# Patient Record
Sex: Female | Born: 1975
Health system: Southern US, Community
[De-identification: ages and names within clinical notes are randomized; demographics above are authoritative.]

## PROBLEM LIST (undated history)

## (undated) HISTORY — PX: WISDOM TOOTH EXTRACTION: SHX21

## (undated) HISTORY — PX: APPENDECTOMY: SHX54

---

## 1998-10-31 ENCOUNTER — Inpatient Hospital Stay (HOSPITAL_COMMUNITY): Admission: AD | Admit: 1998-10-31 | Discharge: 1998-10-31 | Payer: Self-pay | Admitting: Obstetrics and Gynecology

## 1999-06-21 ENCOUNTER — Other Ambulatory Visit: Admission: RE | Admit: 1999-06-21 | Discharge: 1999-06-21 | Payer: Self-pay | Admitting: Obstetrics and Gynecology

## 1999-07-29 ENCOUNTER — Ambulatory Visit (HOSPITAL_COMMUNITY): Admission: RE | Admit: 1999-07-29 | Discharge: 1999-07-29 | Payer: Self-pay | Admitting: Obstetrics and Gynecology

## 2000-03-14 ENCOUNTER — Encounter: Payer: Self-pay | Admitting: Emergency Medicine

## 2000-03-14 ENCOUNTER — Emergency Department (HOSPITAL_COMMUNITY): Admission: EM | Admit: 2000-03-14 | Discharge: 2000-03-14 | Payer: Self-pay | Admitting: Emergency Medicine

## 2000-06-27 ENCOUNTER — Other Ambulatory Visit: Admission: RE | Admit: 2000-06-27 | Discharge: 2000-06-27 | Payer: Self-pay | Admitting: Obstetrics and Gynecology

## 2001-07-24 ENCOUNTER — Other Ambulatory Visit: Admission: RE | Admit: 2001-07-24 | Discharge: 2001-07-24 | Payer: Self-pay | Admitting: *Deleted

## 2002-08-19 ENCOUNTER — Other Ambulatory Visit: Admission: RE | Admit: 2002-08-19 | Discharge: 2002-08-19 | Payer: Self-pay | Admitting: *Deleted

## 2002-11-17 ENCOUNTER — Encounter: Payer: Self-pay | Admitting: Chiropractic Medicine

## 2002-11-17 ENCOUNTER — Ambulatory Visit (HOSPITAL_COMMUNITY): Admission: RE | Admit: 2002-11-17 | Discharge: 2002-11-17 | Payer: Self-pay | Admitting: Chiropractic Medicine

## 2003-09-20 ENCOUNTER — Other Ambulatory Visit: Admission: RE | Admit: 2003-09-20 | Discharge: 2003-09-20 | Payer: Self-pay | Admitting: *Deleted

## 2004-04-19 ENCOUNTER — Emergency Department (HOSPITAL_COMMUNITY): Admission: EM | Admit: 2004-04-19 | Discharge: 2004-04-20 | Payer: Self-pay | Admitting: Emergency Medicine

## 2004-08-21 ENCOUNTER — Inpatient Hospital Stay (HOSPITAL_COMMUNITY): Admission: AD | Admit: 2004-08-21 | Discharge: 2004-08-23 | Payer: Self-pay | Admitting: *Deleted

## 2008-04-04 ENCOUNTER — Inpatient Hospital Stay (HOSPITAL_COMMUNITY): Admission: AD | Admit: 2008-04-04 | Discharge: 2008-04-06 | Payer: Self-pay | Admitting: *Deleted

## 2009-06-08 ENCOUNTER — Emergency Department (HOSPITAL_COMMUNITY): Admission: EM | Admit: 2009-06-08 | Discharge: 2009-06-08 | Payer: Self-pay | Admitting: Emergency Medicine

## 2010-06-09 ENCOUNTER — Ambulatory Visit: Payer: Self-pay | Admitting: Family Medicine

## 2010-06-09 DIAGNOSIS — N809 Endometriosis, unspecified: Secondary | ICD-10-CM | POA: Insufficient documentation

## 2010-06-09 DIAGNOSIS — E288 Other ovarian dysfunction: Secondary | ICD-10-CM

## 2010-06-09 DIAGNOSIS — M999 Biomechanical lesion, unspecified: Secondary | ICD-10-CM | POA: Insufficient documentation

## 2010-06-09 DIAGNOSIS — K5900 Constipation, unspecified: Secondary | ICD-10-CM

## 2010-06-09 DIAGNOSIS — R3915 Urgency of urination: Secondary | ICD-10-CM

## 2010-06-09 DIAGNOSIS — M9981 Other biomechanical lesions of cervical region: Secondary | ICD-10-CM

## 2010-06-09 HISTORY — DX: Endometriosis, unspecified: N80.9

## 2010-06-09 HISTORY — DX: Urgency of urination: R39.15

## 2010-06-09 HISTORY — DX: Biomechanical lesion, unspecified: M99.9

## 2010-06-09 HISTORY — DX: Constipation, unspecified: K59.00

## 2010-06-09 HISTORY — DX: Other ovarian dysfunction: E28.8

## 2010-06-09 LAB — CONVERTED CEMR LAB
Beta hcg, urine, semiquantitative: NEGATIVE
Bilirubin Urine: NEGATIVE
Ketones, urine, test strip: NEGATIVE
Nitrite: NEGATIVE
Protein, U semiquant: NEGATIVE
Urobilinogen, UA: 0.2

## 2010-11-28 NOTE — Assessment & Plan Note (Signed)
Summary: np/[employer] employee/eo   Vital Signs:  Patient profile:   35 year old female Weight:      164 pounds Temp:     98.8 degrees F oral Pulse rate:   74 / minute BP sitting:   141 / 74  (left arm) Cuff size:   regular  Vitals Entered By: Jimmy Footman, CMA (June 09, 2010 1:44 PM)  Primary Care Dean Wonder:  Antoine Primas DO   History of Present Illness: 35 yo female here to establish care  1.  Low back pain, seems to be chronic in nature worse with long amount of sitting or standing no real movement makes it worse, no radiation down the legs, pt had seen a chiropractor before which did help some but was interested in the OMT.  Pt did take motrin from time to time  2.  Endometriosis-  Pt has had it since 1995 multiple lap surgeries that have helped, in the process of having another one at this time, doing very well, has tried birth control but did not like how they made her fell and did not make a difference.  Pt states that it is stage 4 and has had some associated problems such as constipation.  Lst mestral period was in June.   3.  Constipation-  Pt had a colonoscopy about 1 year ago which was normal due to this chronic constipation and diarreha.  Was normal. Pt now states she usually has soft stool does not seem to be too bad. Pt thinks it is her being constipated but doesn't seem to change with food or any other meds she has tried. Denies abdominal pain   4.  Urinary urgency-  Has been chronic since last laproscopic procedure.  Pt was supposed to see aspecialist but did not make the appt.  Pt at the moment says it is not really bad and able to control it, denies fever, chills, nausea, vomiting, dysuria or blood in urine.   5.  Get pap smear by her OB/gyn never had an abnormal one  Current Medications (verified): 1)  None  Allergies (verified): 1)  ! Codeine Sulfate (Codeine Sulfate) 2)  ! Percocet (Oxycodone-Acetaminophen) 3)  ! Levaquin 4)  ! 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   Physical Exam  General:  Well-developed,well-nourished,in no acute distress; alert,appropriate and cooperative throughout examination, vitals reviewed  Eyes:  PERRLA, EOMI Ears:  TM intact b/l Mouth:  MMM uvula midline Lungs:  CTAB  Heart:  RRR no murmu Abdomen:  BS+, minimal tender to palpation over suprapubic area ND Msk:  5/5 strength OMT findings Cervical region C1L, C3-5 R elevated first rib right Thoracic: T3 RSr, T5RSl  T7-9 RSRonL Lumbar L2 RSL Sacrum: left on left.  Pulses:  2+ Extremities:  no edema Neurologic:  CN 2-12 intact Skin:  no rash or lesions appreciated    Impression & Recommendations:  Problem # 1:  URINARY URGENCY, MILD (ICD-788.63) Pt seems to be controlled with hx of endometriosis being stage 4 could be of concern, minimal tender to palpation over bladder on exam but UA normal u preg negative,  will monitor, if worsen will send to urology.   Orders: Urinalysis-[facility] (00000)  Problem # 2:  CONSTIPATION (ICD-564.00) Pt told to increase fiber intakecan consider taking miralax and colace but pt never due to having loose stool already, if continue on f/u appt will have to do rectm exam and see if there is any stool in the vault, no pain at moment. will monitor progress.   Problem # 3:  Preventive Health Care (ICD-V70.0) will get CBC, CMET and FLP soon pt recently had pap smear at [facility], nothing else indicated at this time.   Problem # 4:  ENDOMETRIOSIS (ICD-617.9) will attempt to get more records of problem, sounds like it is throughout the  abdominal cavity, pt set on having another lap to remove more eventhough warn likely will grow back again.  Pt told the risks and still opting for surgery.  will follow.   Other Orders: U Preg-[facility] (81025) OMT 3-4 Body [facility] [id number]) Future Orders: Comp Met-[facility] ([id number]) ... 06/22/2010 Lipid-[facility] ([id number]) ... 06/22/2010 CBC w/Diff-[facility] [id number]) ... 06/22/2010  Patient Instructions: 1)  Great to see you! 2)  I want you to come back in the next week or so and get those labs done 3)  Keep me informed on when you wil have the procedure 4)  If your back acts up again I will do more manipulation 5)  When I get your results I will call you.   Laboratory Results   Urine Tests  Date/Time Received: June 09, 2010 2:21 PM  Date/Time Reported: June 09, 2010 2:34 PM   Routine Urinalysis   Color: yellow Appearance: Clear Glucose: negative   (Normal Range: Negative) Bilirubin: negative   (Normal Range: Negative) Ketone: negative   (Normal Range: Negative) Spec. Gravity: 1.020   (Normal Range: 1.003-1.035) Blood: negative   (Normal Range: Negative) pH: 7.0   (Normal Range: 5.0-8.0) Protein: negative   (Normal Range: Negative) Urobilinogen: 0.2   (Normal Range: 0-1) Nitrite: negative   (Normal Range: Negative) Leukocyte Esterace: negative   (Normal Range: Negative)    Urine HCG: negative Comments: ...............test performed by......Marland KitchenBonnie A. [place], MLS (ASCP)cm

## 2011-02-03 LAB — WET PREP, GENITAL
Clue Cells Wet Prep HPF POC: NONE SEEN
Trich, Wet Prep: NONE SEEN

## 2011-02-03 LAB — CBC
MCHC: 33.2 g/dL (ref 30.0–36.0)
MCV: 85.3 fL (ref 78.0–100.0)
Platelets: 258 10*3/uL (ref 150–400)
RDW: 12.9 % (ref 11.5–15.5)

## 2011-02-03 LAB — DIFFERENTIAL
Eosinophils Relative: 0 % (ref 0–5)
Lymphocytes Relative: 5 % — ABNORMAL LOW (ref 12–46)
Lymphs Abs: 0.7 10*3/uL (ref 0.7–4.0)
Monocytes Relative: 1 % — ABNORMAL LOW (ref 3–12)

## 2011-02-03 LAB — COMPREHENSIVE METABOLIC PANEL
AST: 18 U/L (ref 0–37)
Albumin: 4.2 g/dL (ref 3.5–5.2)
Calcium: 9.4 mg/dL (ref 8.4–10.5)
Creatinine, Ser: 0.78 mg/dL (ref 0.4–1.2)
GFR calc Af Amer: >60 mL/min (ref >60)
GFR calc non Af Amer: >60 mL/min (ref >60)
Sodium: 140 mEq/L (ref 135–145)
Total Protein: 7.8 g/dL (ref 6.0–8.3)

## 2011-02-03 LAB — URINALYSIS, ROUTINE W REFLEX MICROSCOPIC
Glucose, UA: NEGATIVE mg/dL
Nitrite: NEGATIVE
Specific Gravity, Urine: 1.027 (ref 1.005–1.030)
pH: 5.5 (ref 5.0–8.0)

## 2011-02-03 LAB — GC/CHLAMYDIA PROBE AMP, GENITAL: GC Probe Amp, Genital: NEGATIVE

## 2011-02-03 LAB — URINE MICROSCOPIC-ADD ON

## 2011-03-13 NOTE — H&P (Signed)
Tina Gordon, [name]               ACCOUNT NO.:  [id number]   MEDICAL RECORD NO.:  [id number]          PATIENT TYPE:  INP   LOCATION:  9167                          [facility]ILITY:  [facility]   PHYSICIAN:  Tina Gordon, M.D.DATE OF BIRTH:  12-Jan-1976   DATE OF ADMISSION:  04/04/2008  DATE OF DISCHARGE:  04/04/2008                              HISTORY & PHYSICAL   CHIEF COMPLAINT:  Elevated blood pressure, preterm labor.   HISTORY OF PRESENT ILLNESS:  She is a 35 year old white female G 4, P 1,  at [redacted] weeks gestation who presents with a blood pressure of 140s/90s,  increased contractions and elevated blood pressure for augmentation.   ALLERGIES:  SHE HAS ALLERGIES TO CODEINE.   MEDICATIONS:  Prenatal vitamins.   SOCIAL HISTORY:  She is a nonsmoker, nondrinker.  She denies __________.   PAST MEDICAL HISTORY:  She has a past medical history of depression,  endometriosis, gastric ulcer, asthma, migraine, and irritable bowel.  She has a noncontributory social history.   FAMILY HISTORY:  She has a family history of kidney stones,  hypertension, thyroid dysfunction, seizure disorder, stroke,  osteoarthritis, bipolar disorder and depression.  She has a personal  history of two uncomplicated SABs in 2008 and 2004, and a vaginal  delivery of a C-section of an 8 pound female in 2006.   PHYSICAL EXAMINATION:  She is a well developed, well nourished, white  female in mild amount of distress.  Blood pressure 169/88.  HEENT:  Normal.  LUNGS:  Clear.  HEART:  Regular rhythm.  ABDOMEN:  Soft, gravid, nontender,  No abdominal tenderness.  EXTREMITIES:  DTRs 2+ with no evidence of clonus.  __________.  NEUROLOGICAL EXAM:  Nonfocal.  SKIN:  Intact.  CERVIX:  3 to 4 cm, 70% vertex -1.  Amniotomy clear fluid noted.   IMPRESSION:  1. Thirty-nine week intrauterine pregnancy.  2. Probable gestational hypertension.   PLAN:  Proceed with labor augmentation, epidural and attempts at vaginal   delivery.      Tina Gordon, M.D.  Electronically Signed     RJT/MEDQ  D:  04/04/2008  T:  04/04/2008  Job:  [id number]

## 2011-03-16 NOTE — H&P (Signed)
NAME:  Tina Gordon, Tina Gordon               ACCOUNT NO.:  [id number]   MEDICAL RECORD NO.:  [id number]          PATIENT TYPE:  INP   LOCATION:  9160                          [facility]ILITY[facility]   PHYSICIAN:  [place]an[place] B. Earlene Plater, M.D.  DATE OF BIRTH:  22-Nov-1975   DATE OF ADMISSION:  08/21/2004  DATE OF DISCHARGE:                                HISTORY & PHYSICAL   ADMISSION DIAGNOSES:  1.  Thirty-nine-plus-week intrauterine pregnancy.  2.  Possible large-for-gestational-age fetus.   HISTORY OF PRESENT ILLNESS:  A 35 year old white female gravida 2 para 0 A 1  at 39+ weeks for admission for induction of labor.  Pregnancy is complicated  by excessive weight gain at greater than 50 pounds.  A recent ultrasound  showed the estimated fetal weight to be 3522 g.   Prenatal care also otherwise complicated by group B strep urinary tract  infection.   PAST MEDICAL HISTORY, PAST SURGICAL HISTORY, FAMILY HISTORY:  See prenatal  record.   PRENATAL LABORATORY DATA:  See prenatal record.  Group B strep is positive.  Blood type is O positive.   PHYSICAL EXAMINATION:  VITAL SIGNS:  The patient is afebrile with stable  vital signs.  HEART:  Regular rate and rhythm.  LUNGS:  Clear to auscultation.  ABDOMEN:  Fundal height is 40 cm.  PELVIC:  Cervix is 1-2 cm dilated, 50% effaced, -2 station, and vertex.   Fetal heart tones are reactive.   ASSESSMENT:  Thirty-nine-plus-week intrauterine pregnancy, possible large-  for-gestational-age fetus, for induction of labor.  The patient was advised  of the potential increased risk of cesarean section with induction versus  expectant management.     Wesl   WBD/MEDQ  D:  08/21/2004  T:  08/21/2004  Job:  [id number]

## 2011-07-26 LAB — COMPREHENSIVE METABOLIC PANEL
ALT: 15
AST: 31
CO2: 22
Calcium: 9.2
GFR calc Af Amer: >60
Sodium: 135
Total Protein: 6.4

## 2011-07-26 LAB — CBC
HCT: 32.5 — ABNORMAL LOW
MCHC: 34.3
MCV: 87.8
RBC: 3.7 — ABNORMAL LOW
RBC: 4.12
RDW: 14.7
WBC: 16.5 — ABNORMAL HIGH

## 2012-03-11 ENCOUNTER — Emergency Department (HOSPITAL_COMMUNITY)
Admission: EM | Admit: 2012-03-11 | Discharge: 2012-03-11 | Disposition: A | Discharge status: Home / Self Care | Payer: 59 | Source: Home / Self Care | Attending: Emergency Medicine | Admitting: Emergency Medicine

## 2012-03-11 ENCOUNTER — Encounter (HOSPITAL_COMMUNITY): Payer: Self-pay | Admitting: Cardiology

## 2012-03-11 ENCOUNTER — Other Ambulatory Visit (HOSPITAL_COMMUNITY): Payer: Self-pay | Admitting: Orthopedic Surgery

## 2012-03-11 ENCOUNTER — Emergency Department (INDEPENDENT_AMBULATORY_CARE_PROVIDER_SITE_OTHER): Payer: 59

## 2012-03-11 DIAGNOSIS — M25569 Pain in unspecified knee: Secondary | ICD-10-CM

## 2012-03-11 DIAGNOSIS — M2390 Unspecified internal derangement of unspecified knee: Secondary | ICD-10-CM

## 2012-03-11 NOTE — ED Provider Notes (Signed)
Medical screening examination/treatment/procedure(s) were performed by non-physician practitioner and as supervising physician I was immediately available for consultation/collaboration.  Kerrilyn Azbill   Shelma Eiben, MD 03/11/12 1031 

## 2012-03-11 NOTE — ED Notes (Signed)
Pt reports left knee pain that started a month ago [place] Pt felt and heard a pop to her left knee. She states it was tight and achy at first got better and has now been aching and throbbing. Pain is worse with standing for long periods of time and hurts down to her ankle. Pt reports sharpe/cramping  pain to anterior/posterior knee after standing for long periods. Pt has been doing normal ADL's.

## 2012-03-11 NOTE — Discharge Instructions (Signed)
Knee Pain The knee is the complex joint between your thigh and your lower leg. It is made up of bones, tendons, ligaments, and cartilage. The bones that make up the knee are:  The femur in the thigh.   The tibia and fibula in the lower leg.   The patella or kneecap riding in the groove on the lower femur.  CAUSES  Knee pain is a common complaint with many causes. A few of these causes are:  Injury, such as:   A ruptured ligament or tendon injury.   Torn cartilage.   Medical conditions, such as:   Gout   Arthritis   Infections   Overuse, over training or overdoing a physical activity.  Knee pain can be minor or severe. Knee pain can accompany debilitating injury. Minor knee problems often respond well to self-care measures or get well on their own. More serious injuries may need medical intervention or even surgery. SYMPTOMS The knee is complex. Symptoms of knee problems can vary widely. Some of the problems are:  Pain with movement and weight bearing.   Swelling and tenderness.   Buckling of the knee.   Inability to straighten or extend your knee.   Your knee locks and you cannot straighten it.   Warmth and redness with pain and fever.   Deformity or dislocation of the kneecap.  DIAGNOSIS  Determining what is wrong may be very straight forward such as when there is an injury. It can also be challenging because of the complexity of the knee. Tests to make a diagnosis may include:  Your caregiver taking a history and doing a physical exam.   Routine X-rays can be used to rule out other problems. X-rays will not reveal a cartilage tear. Some injuries of the knee can be diagnosed by:   Arthroscopy a surgical technique by which a small video camera is inserted through tiny incisions on the sides of the knee. This procedure is used to examine and repair internal knee joint problems. Tiny instruments can be used during arthroscopy to repair the torn knee cartilage  (meniscus).   Arthrography is a radiology technique. A contrast liquid is directly injected into the knee joint. Internal structures of the knee joint then become visible on X-ray film.   An MRI scan is a non x-ray radiology procedure in which magnetic fields and a computer produce two- or three-dimensional images of the inside of the knee. Cartilage tears are often visible using an MRI scanner. MRI scans have largely replaced arthrography in diagnosing cartilage tears of the knee.   Blood work.   Examination of the fluid that helps to lubricate the knee joint (synovial fluid). This is done by taking a sample out using a needle and a syringe.  TREATMENT The treatment of knee problems depends on the cause. Some of these treatments are:  Depending on the injury, proper casting, splinting, surgery or physical therapy care will be needed.   Give yourself adequate recovery time. Do not overuse your joints. If you begin to get sore during workout routines, back off. Slow down or do fewer repetitions.   For repetitive activities such as cycling or running, maintain your strength and nutrition.   Alternate muscle groups. For example if you are a weight lifter, work the upper body on one day and the lower body the next.   Either tight or weak muscles do not give the proper support for your knee. Tight or weak muscles do not absorb the stress placed   on the knee joint. Keep the muscles surrounding the knee strong.   Take care of mechanical problems.   If you have flat feet, orthotics or special shoes may help. See your caregiver if you need help.   Arch supports, sometimes with wedges on the inner or outer aspect of the heel, can help. These can shift pressure away from the side of the knee most bothered by osteoarthritis.   A brace called an "unloader" brace also may be used to help ease the pressure on the most arthritic side of the knee.   If your caregiver has prescribed crutches, braces,  wraps or ice, use as directed. The acronym for this is PRICE. This means protection, rest, ice, compression and elevation.   Nonsteroidal anti-inflammatory drugs (NSAID's), can help relieve pain. But if taken immediately after an injury, they may actually increase swelling. Take NSAID's with food in your stomach. Stop them if you develop stomach problems. Do not take these if you have a history of ulcers, stomach pain or bleeding from the bowel. Do not take without your caregiver's approval if you have problems with fluid retention, heart failure, or kidney problems.   For ongoing knee problems, physical therapy may be helpful.   Glucosamine and chondroitin are over-the-counter dietary supplements. Both may help relieve the pain of osteoarthritis in the knee. These medicines are different from the usual anti-inflammatory drugs. Glucosamine may decrease the rate of cartilage destruction.   Injections of a corticosteroid drug into your knee joint may help reduce the symptoms of an arthritis flare-up. They may provide pain relief that lasts a few months. You may have to wait a few months between injections. The injections do have a small increased risk of infection, water retention and elevated blood sugar levels.   Hyaluronic acid injected into damaged joints may ease pain and provide lubrication. These injections may work by reducing inflammation. A series of shots may give relief for as long as 6 months.   Topical painkillers. Applying certain ointments to your skin may help relieve the pain and stiffness of osteoarthritis. Ask your pharmacist for suggestions. Many over the-counter products are approved for temporary relief of arthritis pain.   In some countries, doctors often prescribe topical NSAID's for relief of chronic conditions such as arthritis and tendinitis. A review of treatment with NSAID creams found that they worked as well as oral medications but without the serious side effects.    PREVENTION  Maintain a healthy weight. Extra pounds put more strain on your joints.   Get strong, stay limber. Weak muscles are a common cause of knee injuries. Stretching is important. Include flexibility exercises in your workouts.   Be smart about exercise. If you have osteoarthritis, chronic knee pain or recurring injuries, you may need to change the way you exercise. This does not mean you have to stop being active. If your knees ache after jogging or playing basketball, consider switching to swimming, water aerobics or other low-impact activities, at least for a few days a week. Sometimes limiting high-impact activities will provide relief.   Make sure your shoes fit well. Choose footwear that is right for your sport.   Protect your knees. Use the proper gear for knee-sensitive activities. Use kneepads when playing volleyball or laying carpet. Buckle your seat belt every time you drive. Most shattered kneecaps occur in car accidents.   Rest when you are tired.  SEEK MEDICAL CARE IF:  You have knee pain that is continual and does not   seem to be getting better.  SEEK IMMEDIATE MEDICAL CARE IF:  Your knee joint feels hot to the touch and you have a high fever. MAKE SURE YOU:   Understand these instructions.   Will watch your condition.   Will get help right away if you are not doing well or get worse.  Document Released: 08/12/2007 Document Revised: 10/04/2011 Document Reviewed: 08/12/2007 Exit[employer] Patient Information 2012 [employer]. 

## 2012-03-11 NOTE — ED Provider Notes (Signed)
History     CSN: [id number]  Arrival date & time 03/11/12  4098   First MD Initiated Contact with Patient 03/11/12 [phone]      No chief complaint on file.   (Consider location/radiation/quality/duration/timing/severity/associated sxs/prior treatment) Patient is a 36 y.o. female presenting with knee pain. The history is provided by the patient. No language interpreter was used.  Knee Pain This is a new problem. The current episode started 12 to 24 hours ago. The problem occurs constantly. The problem has been gradually worsening. The symptoms are aggravated by walking. The symptoms are relieved by nothing. She has tried nothing for the symptoms.   Pt reports she began having knee pain about a month ago.  Pt reports pain with walking.   Pt does not recall any injury.  Pt has pain to the anterior lateral aspect No past medical history on file.  No past surgical history on file.  No family history on file.  History  Substance Use Topics  . Smoking status: Not on file  . Smokeless tobacco: Not on file  . Alcohol Use: Not on file    OB History    No data available      Review of Systems  Musculoskeletal: Positive for myalgias and joint swelling. Negative for gait problem.  All other systems reviewed and are negative.    Allergies  Codeine sulfate; Levofloxacin; Oxycodone-acetaminophen; and Sulfonamide derivatives  Home Medications  No current outpatient prescriptions on file.  BP 133/79  Pulse 76  Temp(Src) 98.4 F (36.9 C) (Oral)  Resp 18  SpO2 100%  Physical Exam  Nursing note and vitals reviewed. Constitutional: She appears well-developed and well-nourished.  HENT:  Head: Normocephalic.  Musculoskeletal: She exhibits tenderness.       Tender lateral mid knee,  From,  Ns and nv intact  Neurological: She is alert.  Skin: Skin is warm.  Psychiatric: She has a normal mood and affect.    ED Course  Procedures (including critical care time)  Labs Reviewed -  No data to display No results found.   No diagnosis found.    MDM  Knee sleeve,  Ibuprofen Follow up with Tomasita Crumble Dr. Shon Baton for recheck       Lonia Skinner [place], [place] 03/11/12 1008

## 2012-03-14 ENCOUNTER — Other Ambulatory Visit (HOSPITAL_COMMUNITY): Payer: 59

## 2012-03-17 ENCOUNTER — Ambulatory Visit (HOSPITAL_COMMUNITY)
Admission: RE | Admit: 2012-03-17 | Discharge: 2012-03-17 | Disposition: A | Discharge status: Home / Self Care | Payer: 59 | Source: Ambulatory Visit | Attending: Orthopedic Surgery | Admitting: Orthopedic Surgery

## 2012-03-17 DIAGNOSIS — M2390 Unspecified internal derangement of unspecified knee: Secondary | ICD-10-CM

## 2012-03-17 DIAGNOSIS — M25569 Pain in unspecified knee: Secondary | ICD-10-CM | POA: Insufficient documentation

## 2012-03-17 DIAGNOSIS — M2240 Chondromalacia patellae, unspecified knee: Secondary | ICD-10-CM | POA: Insufficient documentation

## 2012-03-17 DIAGNOSIS — R609 Edema, unspecified: Secondary | ICD-10-CM | POA: Insufficient documentation

## 2014-08-13 ENCOUNTER — Other Ambulatory Visit: Payer: Self-pay | Admitting: Family Medicine

## 2014-08-13 MED ORDER — CEPHALEXIN 500 MG PO CAPS: 500.0000 mg | ORAL_CAPSULE | Freq: Three times a day (TID) | ORAL | Status: AC

## 2015-03-23 ENCOUNTER — Other Ambulatory Visit: Payer: Self-pay | Admitting: Family Medicine

## 2015-03-23 DIAGNOSIS — R1310 Dysphagia, unspecified: Secondary | ICD-10-CM

## 2015-03-30 ENCOUNTER — Ambulatory Visit
Admission: RE | Admit: 2015-03-30 | Discharge: 2015-03-30 | Disposition: A | Discharge status: Home / Self Care | Payer: 59 | Source: Ambulatory Visit | Attending: Family Medicine | Admitting: Family Medicine

## 2015-03-30 DIAGNOSIS — R1310 Dysphagia, unspecified: Secondary | ICD-10-CM

## 2016-02-28 ENCOUNTER — Ambulatory Visit (INDEPENDENT_AMBULATORY_CARE_PROVIDER_SITE_OTHER): Payer: 59

## 2016-02-28 ENCOUNTER — Ambulatory Visit (HOSPITAL_COMMUNITY)
Admission: EM | Admit: 2016-02-28 | Discharge: 2016-02-28 | Disposition: A | Discharge status: Home / Self Care | Payer: 59 | Attending: Family Medicine | Admitting: Family Medicine

## 2016-02-28 ENCOUNTER — Encounter (HOSPITAL_COMMUNITY): Payer: Self-pay | Admitting: *Deleted

## 2016-02-28 DIAGNOSIS — S62639B Displaced fracture of distal phalanx of unspecified finger, initial encounter for open fracture: Secondary | ICD-10-CM

## 2016-02-28 DIAGNOSIS — Z23 Encounter for immunization: Secondary | ICD-10-CM

## 2016-02-28 DIAGNOSIS — W231XXA Caught, crushed, jammed, or pinched between stationary objects, initial encounter: Secondary | ICD-10-CM | POA: Diagnosis not present

## 2016-02-28 DIAGNOSIS — S62636B Displaced fracture of distal phalanx of right little finger, initial encounter for open fracture: Secondary | ICD-10-CM | POA: Diagnosis not present

## 2016-02-28 DIAGNOSIS — S62636A Displaced fracture of distal phalanx of right little finger, initial encounter for closed fracture: Secondary | ICD-10-CM | POA: Diagnosis not present

## 2016-02-28 MED ORDER — TETANUS-DIPHTH-ACELL PERTUSSIS 5-2.5-18.5 LF-MCG/0.5 IM SUSP
0.5000 mL | Freq: Once | INTRAMUSCULAR | Status: AC
  Administered 2016-02-28: 0.5 mL via INTRAMUSCULAR

## 2016-02-28 MED ORDER — CEPHALEXIN 500 MG PO CAPS: 500.0000 mg | ORAL_CAPSULE | Freq: Four times a day (QID) | ORAL | Status: DC

## 2016-02-28 MED ORDER — TETANUS-DIPHTH-ACELL PERTUSSIS 5-2.5-18.5 LF-MCG/0.5 IM SUSP
INTRAMUSCULAR | Status: AC
  Filled 2016-02-28: qty 0.5

## 2016-02-28 NOTE — Discharge Instructions (Signed)
See dr Amanda Peagramig on fri at noon , take antibiotic , advil for pain as needed.

## 2016-02-28 NOTE — ED Notes (Signed)
Pt   Got  Her  Finger  Caught  In the  Door  Of a  horse  Trailer    Today  Lac       Present  r    Pinky

## 2016-02-28 NOTE — Consult Note (Signed)
NAMMargaretmary Dys:  Shimkus, Bev               ACCOUNT NO.:  [id number]  MEDICAL RECORD NO.:  [id number]  LOCATION:  [facility]02                         FACILITY:  [facility]  PHYSICIAN:  Dionne AnoWilliam M. 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  She injured her right small finger this evening, it was caught in a horse/cattle trailer loaded with some cattle.  She presents with an open distal phalanx fracture and disarray of the volar pulp tissue.  I was asked to see her by Dr. Bradd CanaryJames Kindl.  ALLERGIES:  Include SULFA, LEVAQUIN, and CODEINE PRODUCTS which cause anaphylaxis type reaction it appears.  MEDICINES:  Reviewed.  PAST MEDICAL AND SURGICAL HISTORY:  Reviewed in great detail.  PHYSICAL EXAMINATION:  General:  Alert and oriented in no acute distress. NECK AND BACK:  Nontender. CHEST:  Clear. ABDOMEN:  Nontender. EXTREMITIES:  She has no evidence of lower extremity trauma.  Right upper extremity has a distal phalanx fracture with open quality and disarray of the volar pulp tissue.  No subungual hematoma or nail bed disarray.  X-ray shows small distal phalanx fracture.  I have reviewed these with her at length and the findings.  IMPRESSION:  Open distal phalanx fracture, right small finger with disarray of the pulp tissue.  PLAN:  She was consented for I and D and repair as necessary.  She underwent an intermetacarpal block.  PROCEDURE:  The patient was verbally consented, underwent an intermetacarpal/flexor tendon sheath block with lidocaine without epinephrine.  Following this, prepped and draped in usual sterile fashion with the Hibiclens scrub and paint.  Following this, she underwent debridement of skin, subcutaneous tissue, open tissue including the bone, this was an excisional debridement of an  open fracture.  Following this, she underwent closed treatment of the open fracture. Following this, she underwent repair of the stellate laceration about the volar pulp.  She tolerated this well. 1. Thus, she underwent I and D, skin, subcutaneous tissue, bone and     associated soft tissues. 2. Open treatment distal phalanx fracture. 3. Repair 2 cm or less laceration. She tolerated the procedure well.  Dr. Artis FlockKindl is going to write for Keflex.  We are going to ask her to just simply stick with ibuprofen.  She will call my office tomorrow if she remembers the pain medicine she could tolerate, but I do not want to stand a chance of any anaphylactic reaction.  We will see her Friday 12 noon, notify should any problems occur.  These notes discussed.  She will be splinted, dressed with Adaptic, Xeroform, and there were no complicating features.  These notes discussed and all questions have been encouraged and answered.     Dionne AnoWilliam M. Amanda PeaGramig, M.D.     [facility] CenterWMG/MEDQ  D:  02/28/2016  T:  02/28/2016  Job:  [id number]

## 2016-03-02 DIAGNOSIS — S62636B Displaced fracture of distal phalanx of right little finger, initial encounter for open fracture: Secondary | ICD-10-CM | POA: Diagnosis not present

## 2016-03-08 MED FILL — FLUCONAZOLE 150 MG TABLET: 150 | 7 days supply | Qty: 3 | Fill #0

## 2016-03-14 DIAGNOSIS — W231XXD Caught, crushed, jammed, or pinched between stationary objects, subsequent encounter: Secondary | ICD-10-CM | POA: Diagnosis not present

## 2016-03-14 DIAGNOSIS — S62363D Nondisplaced fracture of neck of third metacarpal bone, left hand, subsequent encounter for fracture with routine healing: Secondary | ICD-10-CM | POA: Diagnosis not present

## 2016-04-04 DIAGNOSIS — Z4789 Encounter for other orthopedic aftercare: Secondary | ICD-10-CM | POA: Diagnosis not present

## 2016-04-04 DIAGNOSIS — S62636D Displaced fracture of distal phalanx of right little finger, subsequent encounter for fracture with routine healing: Secondary | ICD-10-CM | POA: Diagnosis not present

## 2016-04-17 NOTE — ED Provider Notes (Incomplete)
CSN: [id number]     Arrival date & time 02/28/16  1939 History   First MD Initiated Contact with Patient 02/28/16 2045     Chief Complaint  Patient presents with  . Finger Injury   (Consider location/radiation/quality/duration/timing/severity/associated sxs/prior Treatment) HPI  Past Medical History  Diagnosis Date  . Vaginal delivery 2005, 2009   History reviewed. No pertinent past surgical history. Family History  Problem Relation Age of Onset  . Hypertension Other   . Diabetes Other   . Hyperlipidemia Other    Social History  Substance Use Topics  . Smoking status: Never Smoker   . Smokeless tobacco: None  . Alcohol Use: Yes     Comment: occas/social   OB History    No data available     Review of Systems  Allergies  Codeine sulfate; Levofloxacin; Oxycodone-acetaminophen; and Sulfonamide derivatives  Home Medications   Prior to Admission medications   Medication Sig Start Date End Date Taking? Authorizing Provider  cephALEXin (KEFLEX) 500 MG capsule Take 1 capsule (500 mg total) by mouth 4 (four) times daily. Take all of medicine and drink lots of fluids 02/28/16   Linna HoffJames D Effie Wahlert, MD  cetirizine (ZYRTEC) 10 MG tablet Take 10 mg by mouth daily.    Historical Provider, MD   Meds Ordered and Administered this Visit   Medications  Tdap (BOOSTRIX) injection 0.5 mL (0.5 mLs Intramuscular Given 02/28/16 2137)    BP 143/88 mmHg  Pulse 88  Temp(Src) 98.1 F (36.7 C) (Oral)  Resp 18  SpO2 100% No data found.   Physical Exam  ED Course  Procedures (including critical care time)  Labs Review Labs Reviewed - No data to display  Imaging Review No results found.   Visual Acuity Review  Right Eye Distance:   Left Eye Distance:   Bilateral Distance:    Right Eye Near:   Left Eye Near:    Bilateral Near:         MDM   1. Open fracture of tuft of distal phalanx of finger, initial encounter    ***

## 2016-04-18 MED FILL — PANTOPRAZOLE SOD DR 40 MG T: 40 | 90 days supply | Qty: 90 | Fill #0

## 2016-07-10 DIAGNOSIS — E663 Overweight: Secondary | ICD-10-CM | POA: Diagnosis not present

## 2016-07-10 DIAGNOSIS — L709 Acne, unspecified: Secondary | ICD-10-CM | POA: Diagnosis not present

## 2016-07-10 DIAGNOSIS — Z0000 Encounter for general adult medical examination without abnormal findings: Secondary | ICD-10-CM | POA: Diagnosis not present

## 2016-07-10 DIAGNOSIS — K219 Gastro-esophageal reflux disease without esophagitis: Secondary | ICD-10-CM | POA: Diagnosis not present

## 2016-07-10 DIAGNOSIS — J309 Allergic rhinitis, unspecified: Secondary | ICD-10-CM | POA: Diagnosis not present

## 2016-07-10 DIAGNOSIS — Z6828 Body mass index (BMI) 28.0-28.9, adult: Secondary | ICD-10-CM | POA: Diagnosis not present

## 2016-07-20 DIAGNOSIS — Z01419 Encounter for gynecological examination (general) (routine) without abnormal findings: Secondary | ICD-10-CM | POA: Diagnosis not present

## 2016-07-20 DIAGNOSIS — Z6827 Body mass index (BMI) 27.0-27.9, adult: Secondary | ICD-10-CM | POA: Diagnosis not present

## 2016-07-20 DIAGNOSIS — Z1151 Encounter for screening for human papillomavirus (HPV): Secondary | ICD-10-CM | POA: Diagnosis not present

## 2016-07-23 MED FILL — PANTOPRAZOLE SOD DR 40 MG T: 40 | 90 days supply | Qty: 90 | Fill #1

## 2016-11-02 ENCOUNTER — Telehealth: Payer: 59 | Admitting: Family

## 2016-11-02 DIAGNOSIS — R6889 Other general symptoms and signs: Secondary | ICD-10-CM

## 2016-11-02 NOTE — Progress Notes (Signed)
E visit for Flu like symptoms   We are sorry that you are not feeling well.  Here is how we plan to help! Based on what you have shared with me it looks like you may have a respiratory virus that may be influenza.  Influenza or "the flu" is   an infection caused by a respiratory virus. The flu virus is highly contagious and persons who did not receive their yearly flu vaccination may "catch" the flu from close contact.  We have anti-viral medications to treat the viruses that cause this infection. They are not a "cure" and only shorten the course of the infection. These prescriptions are most effective when they are given within the first 2 days of "flu" symptoms. Antiviral medication are indicated if you have a high risk of complications from the flu. You should  also consider an antiviral medication if you are in close contact with someone who is at risk. These medications can help patients avoid complications from the flu  but have side effects that you should know. Possible side effects from Tamiflu or oseltamivir include nausea, vomiting, diarrhea, dizziness, headaches, eye redness, sleep problems or other respiratory symptoms. You should not take Tamiflu if you have an allergy to oseltamivir or any to the ingredients in Tamiflu.  Based upon your symptoms and potential risk factors I recommend that you follow the flu symptoms recommendation that I have listed below.  ANYONE WHO HAS FLU SYMPTOMS SHOULD: . Stay home. The flu is highly contagious and going out or to work exposes others! . Be sure to drink plenty of fluids. Water is fine as well as fruit juices, sodas and electrolyte beverages. You may want to stay away from caffeine or alcohol. If you are nauseated, try taking small sips of liquids. How do you know if you are getting enough fluid? Your urine should be a pale yellow or almost colorless. . Get rest. . Taking a steamy shower or using a humidifier may help nasal congestion and ease  sore throat pain. Using a saline nasal spray works much the same way. . Cough drops, hard candies and sore throat lozenges may ease your cough. . Line up a caregiver. Have someone check on you regularly.   GET HELP RIGHT AWAY IF: . You cannot keep down liquids or your medications. . You become short of breath . Your fell like you are going to pass out or loose consciousness. . Your symptoms persist after you have completed your treatment plan MAKE SURE YOU   Understand these instructions.  Will watch your condition.  Will get help right away if you are not doing well or get worse.  Your e-visit answers were reviewed by a board certified advanced clinical practitioner to complete your personal care plan.  Depending on the condition, your plan could have included both over the counter or prescription medications.  If there is a problem please reply  once you have received a response from your provider.  Your safety is important to us.  If you have drug allergies check your prescription carefully.    You can use MyChart to ask questions about today's visit, request a non-urgent call back, or ask for a work or school excuse for 24 hours related to this e-Visit. If it has been greater than 24 hours you will need to follow up with your provider, or enter a new e-Visit to address those concerns.  You will get an e-mail in the next two days asking about   your experience.  I hope that your e-visit has been valuable and will speed your recovery. Thank you for using e-visits.   

## 2016-12-03 MED FILL — PANTOPRAZOLE SOD DR 40 MG T: 40 | 90 days supply | Qty: 90 | Fill #0

## 2017-03-29 MED FILL — SERTRALINE HCL 50 MG TABLET: 50 | 30 days supply | Qty: 30 | Fill #0

## 2017-07-11 DIAGNOSIS — J309 Allergic rhinitis, unspecified: Secondary | ICD-10-CM | POA: Diagnosis not present

## 2017-07-11 DIAGNOSIS — K582 Mixed irritable bowel syndrome: Secondary | ICD-10-CM | POA: Diagnosis not present

## 2017-07-11 DIAGNOSIS — R1013 Epigastric pain: Secondary | ICD-10-CM | POA: Diagnosis not present

## 2017-07-11 DIAGNOSIS — Z0000 Encounter for general adult medical examination without abnormal findings: Secondary | ICD-10-CM | POA: Diagnosis not present

## 2017-07-11 DIAGNOSIS — K219 Gastro-esophageal reflux disease without esophagitis: Secondary | ICD-10-CM | POA: Diagnosis not present

## 2017-07-11 DIAGNOSIS — E663 Overweight: Secondary | ICD-10-CM | POA: Diagnosis not present

## 2017-07-11 DIAGNOSIS — Z6829 Body mass index (BMI) 29.0-29.9, adult: Secondary | ICD-10-CM | POA: Diagnosis not present

## 2017-08-04 IMAGING — DX DG FINGER LITTLE 2+V*R*
3 series · 3 of 3 positions shown · non-contrast
Comparison: None.

CLINICAL DATA: Smashed right pinky finger in horse trailer.

EXAM:
RIGHT LITTLE FINGER 2+V

[finger ap]
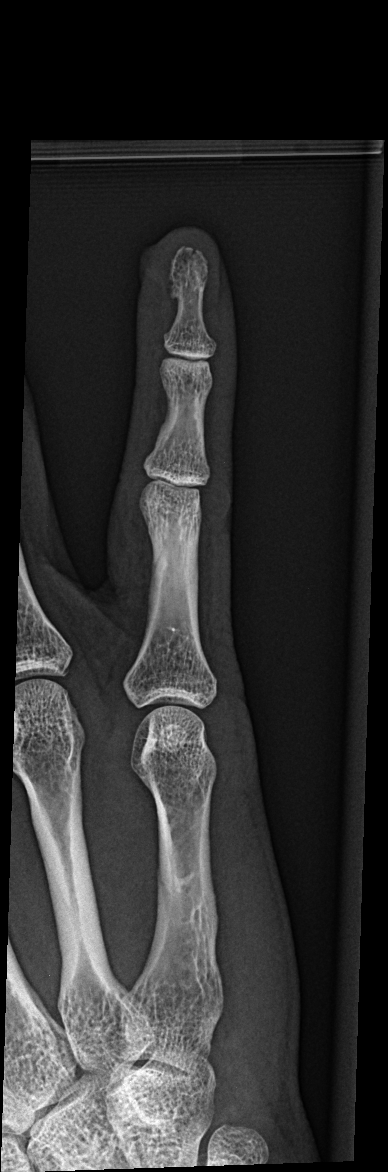

[finger obl]
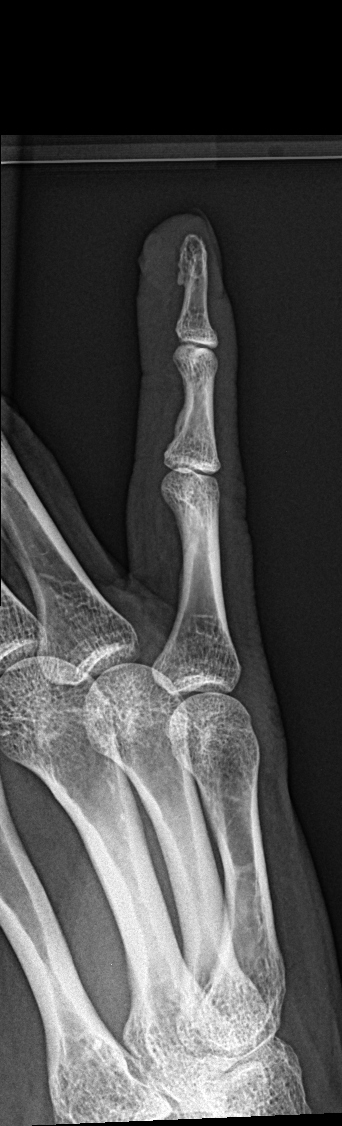

[finger lat]
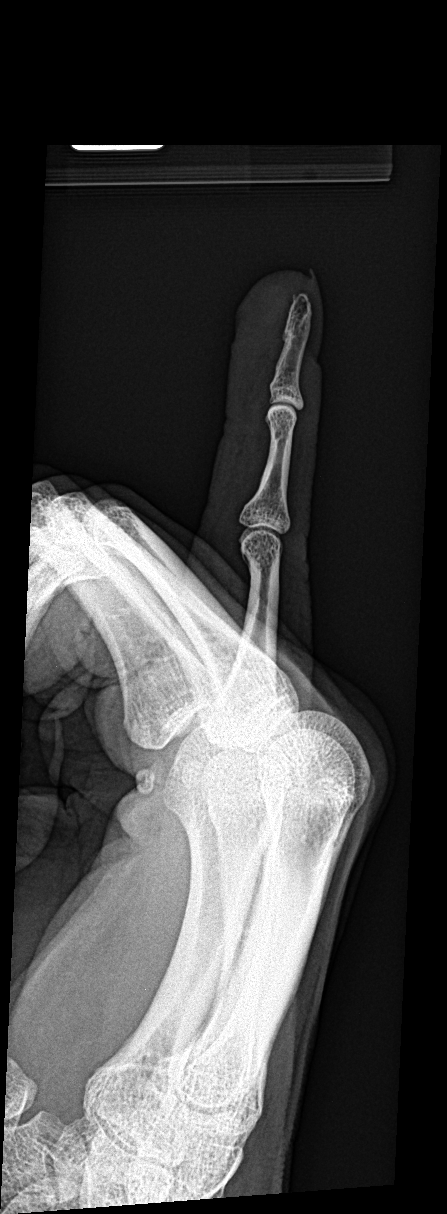

[3 of 3 positions shown; findings below may reference images not displayed]

FINDINGS: There is a tiny obliquely oriented minimally displaced fracture
involving the distal tuft of the fifth digit with expected adjacent
soft tissue swelling. No intra-articular extension. No radiopaque
foreign body. No dislocation. Joint spaces are preserved.
IMPRESSION: Tiny minimally displaced fracture involving the tuft of the pinky
finger without intra-articular extension or radiopaque foreign body.

## 2018-09-29 ENCOUNTER — Encounter (HOSPITAL_BASED_OUTPATIENT_CLINIC_OR_DEPARTMENT_OTHER): Payer: Self-pay | Admitting: *Deleted

## 2018-09-29 ENCOUNTER — Emergency Department (HOSPITAL_BASED_OUTPATIENT_CLINIC_OR_DEPARTMENT_OTHER)
Admission: EM | Admit: 2018-09-29 | Discharge: 2018-09-29 | Disposition: A | Discharge status: Home / Self Care | Payer: No Typology Code available for payment source | Attending: Emergency Medicine | Admitting: Emergency Medicine

## 2018-09-29 ENCOUNTER — Other Ambulatory Visit: Payer: Self-pay

## 2018-09-29 DIAGNOSIS — J0100 Acute maxillary sinusitis, unspecified: Secondary | ICD-10-CM | POA: Diagnosis not present

## 2018-09-29 DIAGNOSIS — Z79899 Other long term (current) drug therapy: Secondary | ICD-10-CM | POA: Diagnosis not present

## 2018-09-29 DIAGNOSIS — R0981 Nasal congestion: Secondary | ICD-10-CM | POA: Diagnosis present

## 2018-09-29 MED ORDER — AMOXICILLIN-POT CLAVULANATE 875-125 MG PO TABS: 1.0000 | ORAL_TABLET | Freq: Two times a day (BID) | ORAL | 0 refills | Status: DC

## 2018-09-29 MED FILL — AMOX-CLAV 875-125 MG TABLET: 875-125 | 7 days supply | Qty: 14 | Fill #0

## 2018-09-29 NOTE — ED Provider Notes (Signed)
[facility] EMERGENCY DEPARTMENT Provider Note   CSN: [id number] Arrival date & time: 09/29/18  1541     History   Chief Complaint Chief Complaint  Patient presents with  . URI    HPI Tina BrunswickHeather R Salway is a 42 y.o. female.  Patient presents the emergency department with complaint of cough, sore throat, sinus pressure, body aches, and nasal congestion for the past 4 days.  No reported fevers.  No nausea, vomiting, or diarrhea.  Cough is nonproductive.  No chest pain or shortness of breath.  Patient has been using over-the-counter medications and Sudafed at home with minimal relief.  Improvement in the past with Augmentin.  Patient called out of work today.     Past Medical History:  Diagnosis Date  . Vaginal delivery 2005, 2009    Patient Active Problem List   Diagnosis Date Noted  . OTHER OVARIAN DYSFUNCTION 06/09/2010  . CONSTIPATION 06/09/2010  . ENDOMETRIOSIS 06/09/2010  . NONALLOPATHIC LESION OF CERVICAL REGION NEC 06/09/2010  . NONALLOPATHIC LESION OF THORACIC REGION NEC 06/09/2010  . NONALLOPATHIC LESION OF SACRAL REGION NEC 06/09/2010  . URINARY URGENCY, MILD 06/09/2010    Past Surgical History:  Procedure Laterality Date  . APPENDECTOMY    . WISDOM TOOTH EXTRACTION       OB History   None      Home Medications    Prior to Admission medications   Medication Sig Start Date End Date Taking? Authorizing Provider  cetirizine (ZYRTEC) 10 MG tablet Take 10 mg by mouth daily.   Yes [provider]  amoxicillin-clavulanate (AUGMENTIN) 875-125 MG tablet Take 1 tablet by mouth every 12 (twelve) hours. 09/29/18   Renne CriglerGeiple, Kattie Santoyo, PA-C    Family History Family History  Problem Relation Age of Onset  . Hypertension Other   . Diabetes Other   . Hyperlipidemia Other     Social History Social History   Tobacco Use  . Smoking status: Never Smoker  . Smokeless tobacco: Never Used  Substance Use Topics  . Alcohol use: Yes    Comment:  occas/social  . Drug use: No     Allergies   Codeine sulfate; Levofloxacin; Oxycodone-acetaminophen; and Sulfonamide derivatives   Review of Systems Review of Systems  Constitutional: Positive for fatigue. Negative for chills and fever.  HENT: Positive for congestion, rhinorrhea, sinus pressure and sore throat. Negative for ear pain.   Eyes: Negative for redness.  Respiratory: Positive for cough. Negative for shortness of breath and wheezing.   Gastrointestinal: Negative for abdominal pain, diarrhea, nausea and vomiting.  Genitourinary: Negative for dysuria.  Musculoskeletal: Positive for myalgias. Negative for neck stiffness.  Skin: Negative for rash.  Neurological: Negative for headaches.  Hematological: Negative for adenopathy.     Physical Exam Updated Vital Signs BP (!) 134/91   Pulse 86   Temp 98.8 F (37.1 C) (Oral)   Resp 16   Ht 5\' 6"  (1.676 m)   Wt 78 kg   SpO2 100%   BMI 27.76 kg/m   Physical Exam  Constitutional: She appears well-developed and well-nourished.  HENT:  Head: Normocephalic and atraumatic.  Right Ear: Tympanic membrane, external ear and ear canal normal.  Left Ear: Tympanic membrane, external ear and ear canal normal.  Nose: Mucosal edema present. No rhinorrhea. Right sinus exhibits maxillary sinus tenderness. Right sinus exhibits no frontal sinus tenderness. Left sinus exhibits maxillary sinus tenderness. Left sinus exhibits no frontal sinus tenderness.  Mouth/Throat: Uvula is midline, oropharynx is clear and moist  and mucous membranes are normal. Mucous membranes are not dry. No oral lesions. No trismus in the jaw. No uvula swelling. No oropharyngeal exudate, posterior oropharyngeal edema, posterior oropharyngeal erythema or tonsillar abscesses.  Eyes: Conjunctivae are normal. Right eye exhibits no discharge. Left eye exhibits no discharge.  Neck: Normal range of motion. Neck supple.  Cardiovascular: Normal rate, regular rhythm and normal  heart sounds.  Pulmonary/Chest: Effort normal and breath sounds normal. No respiratory distress. She has no wheezes. She has no rales.  Abdominal: Soft. There is no tenderness.  Lymphadenopathy:    She has no cervical adenopathy.  Neurological: She is alert.  Skin: Skin is warm and dry.  Psychiatric: She has a normal mood and affect.  Nursing note and vitals reviewed.    ED Treatments / Results  Labs (all labs ordered are listed, but only abnormal results are displayed) Labs Reviewed - No data to display  EKG None  Radiology No results found.  Procedures Procedures (including critical care time)  Medications Ordered in ED Medications - No data to display   Initial Impression / Assessment and Plan / ED Course  I have reviewed the triage vital signs and the nursing notes.  Pertinent labs & imaging results that were available during my care of the patient were reviewed by me and considered in my medical decision making (see chart for details).     Patient seen and examined.   Vital signs reviewed and are as follows: BP (!) 134/91   Pulse 86   Temp 98.8 F (37.1 C) (Oral)   Resp 16   Ht 5\' 6"  (1.676 m)   Wt 78 kg   SpO2 100%   BMI 27.76 kg/m   Discussed utility of antibiotics.  Patient has had symptoms for 4 to 5 days.  Possibly viral.  Prescription given for Augmentin.  Patient encouraged to wait 72 hours to begin to take this.  We discussed that there is a good chance that she will be feeling better and will not need to take the antibiotics if she is improving.   Final Clinical Impressions(s) / ED Diagnoses   Final diagnoses:  Acute non-recurrent maxillary sinusitis   Patient with URI/sinusitis.  Well-appearing.  Treatment as above.  Continue conservative measures.   ED Discharge Orders         Ordered    amoxicillin-clavulanate (AUGMENTIN) 875-125 MG tablet  Every 12 hours     09/29/18 1620           Renne Crigler, PA-C 09/29/18 1634    Tilden Fossa, MD 10/02/18 [phone]

## 2018-09-29 NOTE — ED Triage Notes (Signed)
Cough, sore throat, body aches, and congestion x 3 days.

## 2018-09-29 NOTE — Discharge Instructions (Signed)
Please read and follow all provided instructions.  Your diagnoses today include:  1. Acute non-recurrent maxillary sinusitis     Tests performed today include:  Vital signs. See below for your results today.   Medications prescribed:   Augmentin - antibiotic  You have been prescribed an antibiotic medicine: take the entire course of medicine even if you are feeling better. Stopping early can cause the antibiotic not to work.  Take any prescribed medications only as directed. Treatment for your infection is aimed at treating the symptoms. There are no medications, such as antibiotics, that will cure your infection.   Home care instructions:  Follow any educational materials contained in this packet.   Your illness is contagious and can be spread to others, especially during the first 3 or 4 days.  Take basic precautions such as washing your hands often, covering your mouth when you cough or sneeze, and avoiding public places where you could spread your illness to others.   Please continue drinking plenty of fluids.  Use over-the-counter medicines as needed as directed on packaging for symptom relief.  You may also use ibuprofen or tylenol as directed on packaging for pain or fever.  Do not take multiple medicines containing Tylenol or acetaminophen to avoid taking too much of this medication.  Follow-up instructions: Please follow-up with your primary care provider in the next 3 days for further evaluation of your symptoms if you are not feeling better.   Return instructions:   Please return to the Emergency Department if you experience worsening symptoms.   RETURN IMMEDIATELY IF you develop shortness of breath, confusion or altered mental status, a new rash, become dizzy, faint, or poorly responsive, or are unable to be cared for at home.  Please return if you have persistent vomiting and cannot keep down fluids or develop a fever that is not controlled by tylenol or motrin.     Please return if you have any other emergent concerns.  Additional Information:  Your vital signs today were: BP (!) 134/91    Pulse 86    Temp 98.8 F (37.1 C) (Oral)    Resp 16    Ht 5\' 6"  (1.676 m)    Wt 78 kg    SpO2 100%    BMI 27.76 kg/m  If your blood pressure (BP) was elevated above 135/85 this visit, please have this repeated by your doctor within one month. --------------

## 2020-09-07 ENCOUNTER — Other Ambulatory Visit (HOSPITAL_COMMUNITY): Payer: Self-pay | Admitting: Endodontics

## 2020-09-07 MED FILL — AMOXICILLIN 500 MG CAPSULE: 500 | 7 days supply | Qty: 21 | Fill #0

## 2020-10-24 ENCOUNTER — Other Ambulatory Visit (HOSPITAL_COMMUNITY): Payer: Self-pay | Admitting: Dentistry

## 2020-10-24 MED FILL — diazePAM 5 MG TABS: 5 | 1 days supply | Qty: 2 | Fill #0

## 2020-11-01 DIAGNOSIS — M9904 Segmental and somatic dysfunction of sacral region: Secondary | ICD-10-CM | POA: Diagnosis not present

## 2020-11-01 DIAGNOSIS — M9903 Segmental and somatic dysfunction of lumbar region: Secondary | ICD-10-CM | POA: Diagnosis not present

## 2020-11-01 DIAGNOSIS — M545 Low back pain, unspecified: Secondary | ICD-10-CM | POA: Diagnosis not present

## 2020-11-01 DIAGNOSIS — M9905 Segmental and somatic dysfunction of pelvic region: Secondary | ICD-10-CM | POA: Diagnosis not present

## 2020-11-30 DIAGNOSIS — M9901 Segmental and somatic dysfunction of cervical region: Secondary | ICD-10-CM | POA: Diagnosis not present

## 2020-11-30 DIAGNOSIS — M546 Pain in thoracic spine: Secondary | ICD-10-CM | POA: Diagnosis not present

## 2020-11-30 DIAGNOSIS — M542 Cervicalgia: Secondary | ICD-10-CM | POA: Diagnosis not present

## 2020-11-30 DIAGNOSIS — M545 Low back pain, unspecified: Secondary | ICD-10-CM | POA: Diagnosis not present

## 2020-12-19 DIAGNOSIS — Z13220 Encounter for screening for lipoid disorders: Secondary | ICD-10-CM | POA: Diagnosis not present

## 2020-12-19 DIAGNOSIS — Z0000 Encounter for general adult medical examination without abnormal findings: Secondary | ICD-10-CM | POA: Diagnosis not present

## 2020-12-19 DIAGNOSIS — Z131 Encounter for screening for diabetes mellitus: Secondary | ICD-10-CM | POA: Diagnosis not present

## 2020-12-19 DIAGNOSIS — E663 Overweight: Secondary | ICD-10-CM | POA: Diagnosis not present

## 2020-12-28 ENCOUNTER — Other Ambulatory Visit (HOSPITAL_COMMUNITY): Payer: Self-pay | Admitting: Family Medicine

## 2020-12-28 DIAGNOSIS — E663 Overweight: Secondary | ICD-10-CM | POA: Diagnosis not present

## 2020-12-28 DIAGNOSIS — K219 Gastro-esophageal reflux disease without esophagitis: Secondary | ICD-10-CM | POA: Diagnosis not present

## 2020-12-28 DIAGNOSIS — Z6830 Body mass index (BMI) 30.0-30.9, adult: Secondary | ICD-10-CM | POA: Diagnosis not present

## 2020-12-28 DIAGNOSIS — R059 Cough, unspecified: Secondary | ICD-10-CM | POA: Diagnosis not present

## 2020-12-28 DIAGNOSIS — K582 Mixed irritable bowel syndrome: Secondary | ICD-10-CM | POA: Diagnosis not present

## 2020-12-28 DIAGNOSIS — J309 Allergic rhinitis, unspecified: Secondary | ICD-10-CM | POA: Diagnosis not present

## 2020-12-28 DIAGNOSIS — Z0000 Encounter for general adult medical examination without abnormal findings: Secondary | ICD-10-CM | POA: Diagnosis not present

## 2020-12-28 MED FILL — OMEPRAZOLE 40 MG CPDR: 40 | 90 days supply | Qty: 90 | Fill #0

## 2021-01-11 DIAGNOSIS — M546 Pain in thoracic spine: Secondary | ICD-10-CM | POA: Diagnosis not present

## 2021-01-11 DIAGNOSIS — M542 Cervicalgia: Secondary | ICD-10-CM | POA: Diagnosis not present

## 2021-01-11 DIAGNOSIS — M545 Low back pain, unspecified: Secondary | ICD-10-CM | POA: Diagnosis not present

## 2021-01-11 DIAGNOSIS — M9901 Segmental and somatic dysfunction of cervical region: Secondary | ICD-10-CM | POA: Diagnosis not present

## 2021-02-03 ENCOUNTER — Other Ambulatory Visit (HOSPITAL_COMMUNITY): Payer: Self-pay

## 2021-02-28 DIAGNOSIS — M9902 Segmental and somatic dysfunction of thoracic region: Secondary | ICD-10-CM | POA: Diagnosis not present

## 2021-02-28 DIAGNOSIS — M542 Cervicalgia: Secondary | ICD-10-CM | POA: Diagnosis not present

## 2021-02-28 DIAGNOSIS — M546 Pain in thoracic spine: Secondary | ICD-10-CM | POA: Diagnosis not present

## 2021-02-28 DIAGNOSIS — M545 Low back pain, unspecified: Secondary | ICD-10-CM | POA: Diagnosis not present

## 2021-03-21 ENCOUNTER — Other Ambulatory Visit (HOSPITAL_COMMUNITY): Payer: Self-pay

## 2021-03-21 MED ORDER — OMEPRAZOLE 40 MG PO CPDR
40.0000 mg | DELAYED_RELEASE_CAPSULE | Freq: Every morning | ORAL | 0 refills | Status: DC
  Filled 2021-03-21: qty 90, 90d supply, fill #0

## 2021-03-28 DIAGNOSIS — M542 Cervicalgia: Secondary | ICD-10-CM | POA: Diagnosis not present

## 2021-03-28 DIAGNOSIS — M546 Pain in thoracic spine: Secondary | ICD-10-CM | POA: Diagnosis not present

## 2021-03-28 DIAGNOSIS — M545 Low back pain, unspecified: Secondary | ICD-10-CM | POA: Diagnosis not present

## 2021-03-28 DIAGNOSIS — M9902 Segmental and somatic dysfunction of thoracic region: Secondary | ICD-10-CM | POA: Diagnosis not present

## 2021-04-11 ENCOUNTER — Other Ambulatory Visit (HOSPITAL_COMMUNITY): Payer: Self-pay

## 2021-04-11 MED ORDER — SODIUM FLUORIDE 5000 PPM 1.1 % DT PSTE
PASTE | DENTAL | 3 refills | Status: DC
  Filled 2021-04-11: qty 100, 30d supply, fill #0

## 2021-04-19 ENCOUNTER — Other Ambulatory Visit (HOSPITAL_COMMUNITY): Payer: Self-pay

## 2021-05-08 DIAGNOSIS — M9903 Segmental and somatic dysfunction of lumbar region: Secondary | ICD-10-CM | POA: Diagnosis not present

## 2021-05-08 DIAGNOSIS — M9901 Segmental and somatic dysfunction of cervical region: Secondary | ICD-10-CM | POA: Diagnosis not present

## 2021-05-08 DIAGNOSIS — M9902 Segmental and somatic dysfunction of thoracic region: Secondary | ICD-10-CM | POA: Diagnosis not present

## 2021-05-08 DIAGNOSIS — M9904 Segmental and somatic dysfunction of sacral region: Secondary | ICD-10-CM | POA: Diagnosis not present

## 2021-06-06 DIAGNOSIS — M9902 Segmental and somatic dysfunction of thoracic region: Secondary | ICD-10-CM | POA: Diagnosis not present

## 2021-06-06 DIAGNOSIS — M9903 Segmental and somatic dysfunction of lumbar region: Secondary | ICD-10-CM | POA: Diagnosis not present

## 2021-06-06 DIAGNOSIS — M9904 Segmental and somatic dysfunction of sacral region: Secondary | ICD-10-CM | POA: Diagnosis not present

## 2021-06-06 DIAGNOSIS — M9901 Segmental and somatic dysfunction of cervical region: Secondary | ICD-10-CM | POA: Diagnosis not present

## 2021-06-27 DIAGNOSIS — Z6830 Body mass index (BMI) 30.0-30.9, adult: Secondary | ICD-10-CM | POA: Diagnosis not present

## 2021-06-27 DIAGNOSIS — J019 Acute sinusitis, unspecified: Secondary | ICD-10-CM | POA: Diagnosis not present

## 2021-06-28 DIAGNOSIS — M9904 Segmental and somatic dysfunction of sacral region: Secondary | ICD-10-CM | POA: Diagnosis not present

## 2021-06-28 DIAGNOSIS — M9902 Segmental and somatic dysfunction of thoracic region: Secondary | ICD-10-CM | POA: Diagnosis not present

## 2021-06-28 DIAGNOSIS — M9903 Segmental and somatic dysfunction of lumbar region: Secondary | ICD-10-CM | POA: Diagnosis not present

## 2021-06-28 DIAGNOSIS — M9901 Segmental and somatic dysfunction of cervical region: Secondary | ICD-10-CM | POA: Diagnosis not present

## 2021-07-03 ENCOUNTER — Other Ambulatory Visit (HOSPITAL_COMMUNITY): Payer: Self-pay

## 2021-07-04 ENCOUNTER — Other Ambulatory Visit (HOSPITAL_COMMUNITY): Payer: Self-pay

## 2021-07-04 MED ORDER — OMEPRAZOLE 40 MG PO CPDR
40.0000 mg | DELAYED_RELEASE_CAPSULE | Freq: Every day | ORAL | 1 refills | Status: DC
  Filled 2021-07-04: qty 90, 90d supply, fill #0
  Filled 2021-09-11 – 2021-10-02 (×3): qty 90, 90d supply, fill #1

## 2021-08-09 DIAGNOSIS — M9902 Segmental and somatic dysfunction of thoracic region: Secondary | ICD-10-CM | POA: Diagnosis not present

## 2021-08-09 DIAGNOSIS — M9901 Segmental and somatic dysfunction of cervical region: Secondary | ICD-10-CM | POA: Diagnosis not present

## 2021-08-09 DIAGNOSIS — M9903 Segmental and somatic dysfunction of lumbar region: Secondary | ICD-10-CM | POA: Diagnosis not present

## 2021-08-09 DIAGNOSIS — M9904 Segmental and somatic dysfunction of sacral region: Secondary | ICD-10-CM | POA: Diagnosis not present

## 2021-09-11 ENCOUNTER — Other Ambulatory Visit (HOSPITAL_COMMUNITY): Payer: Self-pay

## 2021-09-13 ENCOUNTER — Other Ambulatory Visit (HOSPITAL_COMMUNITY): Payer: Self-pay

## 2021-09-14 ENCOUNTER — Other Ambulatory Visit (HOSPITAL_COMMUNITY): Payer: Self-pay

## 2021-09-19 DIAGNOSIS — M9903 Segmental and somatic dysfunction of lumbar region: Secondary | ICD-10-CM | POA: Diagnosis not present

## 2021-09-19 DIAGNOSIS — M9904 Segmental and somatic dysfunction of sacral region: Secondary | ICD-10-CM | POA: Diagnosis not present

## 2021-09-19 DIAGNOSIS — M9901 Segmental and somatic dysfunction of cervical region: Secondary | ICD-10-CM | POA: Diagnosis not present

## 2021-09-19 DIAGNOSIS — M9902 Segmental and somatic dysfunction of thoracic region: Secondary | ICD-10-CM | POA: Diagnosis not present

## 2021-09-25 ENCOUNTER — Other Ambulatory Visit (HOSPITAL_COMMUNITY): Payer: Self-pay

## 2021-10-02 ENCOUNTER — Other Ambulatory Visit (HOSPITAL_COMMUNITY): Payer: Self-pay

## 2021-10-17 DIAGNOSIS — H5203 Hypermetropia, bilateral: Secondary | ICD-10-CM | POA: Diagnosis not present

## 2021-10-25 DIAGNOSIS — M9904 Segmental and somatic dysfunction of sacral region: Secondary | ICD-10-CM | POA: Diagnosis not present

## 2021-10-25 DIAGNOSIS — M9903 Segmental and somatic dysfunction of lumbar region: Secondary | ICD-10-CM | POA: Diagnosis not present

## 2021-10-25 DIAGNOSIS — M9901 Segmental and somatic dysfunction of cervical region: Secondary | ICD-10-CM | POA: Diagnosis not present

## 2021-10-25 DIAGNOSIS — M9902 Segmental and somatic dysfunction of thoracic region: Secondary | ICD-10-CM | POA: Diagnosis not present

## 2021-11-27 DIAGNOSIS — M9904 Segmental and somatic dysfunction of sacral region: Secondary | ICD-10-CM | POA: Diagnosis not present

## 2021-11-27 DIAGNOSIS — M9902 Segmental and somatic dysfunction of thoracic region: Secondary | ICD-10-CM | POA: Diagnosis not present

## 2021-11-27 DIAGNOSIS — M9903 Segmental and somatic dysfunction of lumbar region: Secondary | ICD-10-CM | POA: Diagnosis not present

## 2021-11-27 DIAGNOSIS — M9901 Segmental and somatic dysfunction of cervical region: Secondary | ICD-10-CM | POA: Diagnosis not present

## 2021-12-25 DIAGNOSIS — M9901 Segmental and somatic dysfunction of cervical region: Secondary | ICD-10-CM | POA: Diagnosis not present

## 2021-12-25 DIAGNOSIS — M9904 Segmental and somatic dysfunction of sacral region: Secondary | ICD-10-CM | POA: Diagnosis not present

## 2021-12-25 DIAGNOSIS — M9902 Segmental and somatic dysfunction of thoracic region: Secondary | ICD-10-CM | POA: Diagnosis not present

## 2021-12-25 DIAGNOSIS — M9903 Segmental and somatic dysfunction of lumbar region: Secondary | ICD-10-CM | POA: Diagnosis not present

## 2022-01-02 ENCOUNTER — Other Ambulatory Visit (HOSPITAL_COMMUNITY): Payer: Self-pay

## 2022-01-02 DIAGNOSIS — Z0000 Encounter for general adult medical examination without abnormal findings: Secondary | ICD-10-CM | POA: Diagnosis not present

## 2022-01-02 DIAGNOSIS — Z13220 Encounter for screening for lipoid disorders: Secondary | ICD-10-CM | POA: Diagnosis not present

## 2022-01-02 DIAGNOSIS — K219 Gastro-esophageal reflux disease without esophagitis: Secondary | ICD-10-CM | POA: Diagnosis not present

## 2022-01-02 DIAGNOSIS — J45990 Exercise induced bronchospasm: Secondary | ICD-10-CM | POA: Diagnosis not present

## 2022-01-02 DIAGNOSIS — R0602 Shortness of breath: Secondary | ICD-10-CM | POA: Diagnosis not present

## 2022-01-02 DIAGNOSIS — Z79899 Other long term (current) drug therapy: Secondary | ICD-10-CM | POA: Diagnosis not present

## 2022-01-02 MED ORDER — FLUTICASONE PROPIONATE HFA 110 MCG/ACT IN AERO
1.0000 | INHALATION_SPRAY | Freq: Two times a day (BID) | RESPIRATORY_TRACT | 2 refills | Status: DC
  Filled 2022-01-02: qty 12, 60d supply, fill #0

## 2022-01-24 DIAGNOSIS — M9903 Segmental and somatic dysfunction of lumbar region: Secondary | ICD-10-CM | POA: Diagnosis not present

## 2022-01-24 DIAGNOSIS — M9904 Segmental and somatic dysfunction of sacral region: Secondary | ICD-10-CM | POA: Diagnosis not present

## 2022-01-24 DIAGNOSIS — M9902 Segmental and somatic dysfunction of thoracic region: Secondary | ICD-10-CM | POA: Diagnosis not present

## 2022-01-24 DIAGNOSIS — M9901 Segmental and somatic dysfunction of cervical region: Secondary | ICD-10-CM | POA: Diagnosis not present

## 2022-02-02 ENCOUNTER — Other Ambulatory Visit (HOSPITAL_COMMUNITY): Payer: Self-pay

## 2022-02-05 ENCOUNTER — Other Ambulatory Visit (HOSPITAL_COMMUNITY): Payer: Self-pay

## 2022-02-05 MED ORDER — OMEPRAZOLE 40 MG PO CPDR
40.0000 mg | DELAYED_RELEASE_CAPSULE | Freq: Every day | ORAL | 3 refills | Status: DC
  Filled 2022-02-05: qty 90, 90d supply, fill #0
  Filled 2022-06-08: qty 90, 90d supply, fill #1
  Filled 2022-12-17: qty 90, 90d supply, fill #2

## 2022-03-13 DIAGNOSIS — M9903 Segmental and somatic dysfunction of lumbar region: Secondary | ICD-10-CM | POA: Diagnosis not present

## 2022-03-13 DIAGNOSIS — M9902 Segmental and somatic dysfunction of thoracic region: Secondary | ICD-10-CM | POA: Diagnosis not present

## 2022-03-13 DIAGNOSIS — M9904 Segmental and somatic dysfunction of sacral region: Secondary | ICD-10-CM | POA: Diagnosis not present

## 2022-03-13 DIAGNOSIS — M9901 Segmental and somatic dysfunction of cervical region: Secondary | ICD-10-CM | POA: Diagnosis not present

## 2022-04-25 DIAGNOSIS — M9902 Segmental and somatic dysfunction of thoracic region: Secondary | ICD-10-CM | POA: Diagnosis not present

## 2022-04-25 DIAGNOSIS — M9901 Segmental and somatic dysfunction of cervical region: Secondary | ICD-10-CM | POA: Diagnosis not present

## 2022-04-25 DIAGNOSIS — M9903 Segmental and somatic dysfunction of lumbar region: Secondary | ICD-10-CM | POA: Diagnosis not present

## 2022-04-25 DIAGNOSIS — M9904 Segmental and somatic dysfunction of sacral region: Secondary | ICD-10-CM | POA: Diagnosis not present

## 2022-05-02 ENCOUNTER — Telehealth: Payer: Self-pay | Admitting: Nurse Practitioner

## 2022-05-02 DIAGNOSIS — J40 Bronchitis, not specified as acute or chronic: Secondary | ICD-10-CM

## 2022-05-02 DIAGNOSIS — R059 Cough, unspecified: Secondary | ICD-10-CM | POA: Insufficient documentation

## 2022-05-02 DIAGNOSIS — E538 Deficiency of other specified B group vitamins: Secondary | ICD-10-CM | POA: Insufficient documentation

## 2022-05-02 DIAGNOSIS — J45990 Exercise induced bronchospasm: Secondary | ICD-10-CM

## 2022-05-02 DIAGNOSIS — L709 Acne, unspecified: Secondary | ICD-10-CM | POA: Insufficient documentation

## 2022-05-02 DIAGNOSIS — J309 Allergic rhinitis, unspecified: Secondary | ICD-10-CM

## 2022-05-02 DIAGNOSIS — E663 Overweight: Secondary | ICD-10-CM

## 2022-05-02 DIAGNOSIS — K589 Irritable bowel syndrome without diarrhea: Secondary | ICD-10-CM | POA: Insufficient documentation

## 2022-05-02 DIAGNOSIS — K219 Gastro-esophageal reflux disease without esophagitis: Secondary | ICD-10-CM

## 2022-05-02 DIAGNOSIS — U071 COVID-19: Secondary | ICD-10-CM

## 2022-05-02 HISTORY — DX: Allergic rhinitis, unspecified: J30.9

## 2022-05-02 HISTORY — DX: Cough, unspecified: R05.9

## 2022-05-02 HISTORY — DX: Gastro-esophageal reflux disease without esophagitis: K21.9

## 2022-05-02 HISTORY — DX: Deficiency of other specified B group vitamins: E53.8

## 2022-05-02 HISTORY — DX: Overweight: E66.3

## 2022-05-02 HISTORY — DX: Acne, unspecified: L70.9

## 2022-05-02 HISTORY — DX: Exercise induced bronchospasm: J45.990

## 2022-05-02 HISTORY — DX: Irritable bowel syndrome, unspecified: K58.9

## 2022-05-02 MED ORDER — ALBUTEROL SULFATE HFA 108 (90 BASE) MCG/ACT IN AERS: 2.0000 | INHALATION_SPRAY | Freq: Four times a day (QID) | RESPIRATORY_TRACT | 0 refills | Status: DC | PRN

## 2022-05-02 MED ORDER — AZITHROMYCIN 250 MG PO TABS: ORAL_TABLET | ORAL | 0 refills | Status: AC

## 2022-05-02 NOTE — Progress Notes (Signed)
Virtual Visit Consent   [name], you are scheduled for a virtual visit with a [facility] provider today. Just as with appointments in the office, your consent must be obtained to participate. Your consent will be active for this visit and any virtual visit you may have with one of our providers in the next 365 days. If you have a MyChart account, a copy of this consent can be sent to you electronically.  As this is a virtual visit, video technology does not allow for your provider to perform a traditional examination. This may limit your provider's ability to fully assess your condition. If your provider identifies any concerns that need to be evaluated in person or the need to arrange testing (such as labs, EKG, etc.), we will make arrangements to do so. Although advances in technology are sophisticated, we cannot ensure that it will always work on either your end or our end. If the connection with a video visit is poor, the visit may have to be switched to a telephone visit. With either a video or telephone visit, we are not always able to ensure that we have a secure connection.  By engaging in this virtual visit, you consent to the provision of healthcare and authorize for your insurance to be billed (if applicable) for the services provided during this visit. Depending on your insurance coverage, you may receive a charge related to this service.  I need to obtain your verbal consent now. Are you willing to proceed with your visit today? TERRISA CURFMAN has provided verbal consent on 05/02/2022 for a virtual visit (video or telephone). Viviano Simas, FNP  Date: 05/02/2022 3:29 PM  Virtual Visit via Video Note   I, Viviano Simas, connected with  Tina Gordon  ([id number], 04/19/1976) on 05/02/22 at  3:30 PM EDT by a video-enabled telemedicine application and verified that I am speaking with the correct person using two identifiers.  Location: Patient: Virtual Visit Location Patient:  Home Provider: Virtual Visit Location Provider: Home Office   I discussed the limitations of evaluation and management by telemedicine and the availability of in person appointments. The patient expressed understanding and agreed to proceed.    History of Present Illness: Tina Gordon is a 46 y.o. who identifies as a female who was assigned female at birth, and is being seen today after testing positive for COVID with an at home test.   She has been feeling sick for the past 4 days.  Her symptoms include: sore throat, post nasal drainage, sinus congestion and a cough.   She has had COVID in the past, denies any serious illness prior   She has had 3 COVID vaccines prior  She denies a history of asthma   She has been using benadryl, mucinex and advil for relief.   Today her worst symptoms include fever overnight and the cough today.   Problems:  Patient Active Problem List   Diagnosis Date Noted   OTHER OVARIAN DYSFUNCTION 06/09/2010   CONSTIPATION 06/09/2010   ENDOMETRIOSIS 06/09/2010   NONALLOPATHIC LESION OF CERVICAL REGION NEC 06/09/2010   NONALLOPATHIC LESION OF THORACIC REGION NEC 06/09/2010   NONALLOPATHIC LESION OF SACRAL REGION NEC 06/09/2010   URINARY URGENCY, MILD 06/09/2010    Allergies:  Allergies  Allergen Reactions   Codeine Sulfate     REACTION: heart palpatations   Levofloxacin     REACTION: severe dizziness   Oxycodone-Acetaminophen     REACTION: anaphylaxsis   Sulfonamide Derivatives  REACTION: rash   Medications:  Current Outpatient Medications:    amoxicillin-clavulanate (AUGMENTIN) 875-125 MG tablet, Take 1 tablet by mouth every 12 (twelve) hours., Disp: 14 tablet, Rfl: 0   cetirizine (ZYRTEC) 10 MG tablet, Take 10 mg by mouth daily., Disp: , Rfl:    fluticasone (FLOVENT HFA) 110 MCG/ACT inhaler, Inhale 1 puff into the lungs 2 (two) times daily., Disp: 12 g, Rfl: 2   omeprazole (PRILOSEC) 40 MG capsule, TAKE 1 CAPSULE BY MOUTH DAILY 30  MINUTES BEFORE MORNING MEAL, Disp: 90 capsule, Rfl: 0   omeprazole (PRILOSEC) 40 MG capsule, Take 1 capsule (40 mg total) by mouth 30 minutes before morning meal., Disp: 90 capsule, Rfl: 3   Sodium Fluoride (SODIUM FLUORIDE 5000 PPM) 1.1 % PSTE, Apply thin ribbon/pea-sized amount to toothbrush. Brush teeth thoroughly, for at least 2 min. Use in place of conventional toothpaste. Spit out, do not swallow., Disp: 100 mL, Rfl: 3  Observations/Objective: Patient is well-developed, well-nourished in no acute distress.  Resting comfortably  at home.  Head is normocephalic, atraumatic.  No labored breathing.  Speech is clear and coherent with logical content.  Patient is alert and oriented at baseline.    Assessment and Plan: 1. COVID-19 Discussed isolation precautions, continuing Mucinex, Advil and Benadryl as needed Rest, push fluids and assure adequate caloric intake   2. Bronchitis  - azithromycin (ZITHROMAX) 250 MG tablet; Take 2 tablets on day 1, then 1 tablet daily on days 2 through 5  Dispense: 6 tablet; Refill: 0 - albuterol (VENTOLIN HFA) 108 (90 Base) MCG/ACT inhaler; Inhale 2 puffs into the lungs every 6 (six) hours as needed for wheezing or shortness of breath.  Dispense: 8 g; Refill: 0     Follow Up Instructions: I discussed the assessment and treatment plan with the patient. The patient was provided an opportunity to ask questions and all were answered. The patient agreed with the plan and demonstrated an understanding of the instructions.  A copy of instructions were sent to the patient via MyChart unless otherwise noted below.   The patient was advised to call back or seek an in-person evaluation if the symptoms worsen or if the condition fails to improve as anticipated.  Time:  I spent 10 minutes with the patient via telehealth technology discussing the above problems/concerns.    Viviano Simas, FNP

## 2022-05-24 ENCOUNTER — Other Ambulatory Visit: Payer: Self-pay | Admitting: Nurse Practitioner

## 2022-05-24 DIAGNOSIS — J40 Bronchitis, not specified as acute or chronic: Secondary | ICD-10-CM

## 2022-05-30 DIAGNOSIS — M9902 Segmental and somatic dysfunction of thoracic region: Secondary | ICD-10-CM | POA: Diagnosis not present

## 2022-05-30 DIAGNOSIS — M9903 Segmental and somatic dysfunction of lumbar region: Secondary | ICD-10-CM | POA: Diagnosis not present

## 2022-05-30 DIAGNOSIS — M9901 Segmental and somatic dysfunction of cervical region: Secondary | ICD-10-CM | POA: Diagnosis not present

## 2022-05-30 DIAGNOSIS — M9904 Segmental and somatic dysfunction of sacral region: Secondary | ICD-10-CM | POA: Diagnosis not present

## 2022-06-08 ENCOUNTER — Other Ambulatory Visit (HOSPITAL_COMMUNITY): Payer: Self-pay

## 2022-06-12 DIAGNOSIS — M9901 Segmental and somatic dysfunction of cervical region: Secondary | ICD-10-CM | POA: Diagnosis not present

## 2022-06-12 DIAGNOSIS — M9903 Segmental and somatic dysfunction of lumbar region: Secondary | ICD-10-CM | POA: Diagnosis not present

## 2022-06-12 DIAGNOSIS — M9904 Segmental and somatic dysfunction of sacral region: Secondary | ICD-10-CM | POA: Diagnosis not present

## 2022-06-12 DIAGNOSIS — M9902 Segmental and somatic dysfunction of thoracic region: Secondary | ICD-10-CM | POA: Diagnosis not present

## 2022-08-21 DIAGNOSIS — M9904 Segmental and somatic dysfunction of sacral region: Secondary | ICD-10-CM | POA: Diagnosis not present

## 2022-08-21 DIAGNOSIS — M9901 Segmental and somatic dysfunction of cervical region: Secondary | ICD-10-CM | POA: Diagnosis not present

## 2022-08-21 DIAGNOSIS — M9903 Segmental and somatic dysfunction of lumbar region: Secondary | ICD-10-CM | POA: Diagnosis not present

## 2022-08-21 DIAGNOSIS — M9902 Segmental and somatic dysfunction of thoracic region: Secondary | ICD-10-CM | POA: Diagnosis not present

## 2022-09-11 ENCOUNTER — Telehealth: Payer: 59 | Admitting: Family Medicine

## 2022-09-11 ENCOUNTER — Other Ambulatory Visit (HOSPITAL_COMMUNITY): Payer: Self-pay

## 2022-09-11 DIAGNOSIS — R21 Rash and other nonspecific skin eruption: Secondary | ICD-10-CM

## 2022-09-11 MED ORDER — PERMETHRIN 5 % EX CREA
TOPICAL_CREAM | CUTANEOUS | 0 refills | Status: DC
  Filled 2022-09-11: qty 60, 1d supply, fill #0

## 2022-09-11 NOTE — Progress Notes (Signed)
E-Visit for Rash/Mites/Scabies  We are sorry that you are not feeling well. Here is how we plan to help!  Based on what you shared with me it looks like you have mites/scabies.  Scabies is an infection caused by very tiny mites that burrow into the skin.  They cause severe itching. Though children are most commonly infected, anyone can get scabies.  Scabies mites can pass from person to person through physical close contact.  They can also be passed through shared clothing, towels, and bedding.  Scabies infection is not usually dangerous, but it is uncomfortable.  Because it is so contagious, scabies should be treated immediately to keep the infection from spreading.  If you have children in day care, please have any exposed children examined by their pediatrician. .   I have prescribed Permethrin topical cream 5% thoroughly massage cream from head to soles of feet; leave on for 8 to 14 hours before removing (shower or bath). May repeat if living mites are observed 14 days after first treatment; one application is generally curative.    HOME CARE:  You should treat all members in your household whether they show symptoms or not. Wash towels, clothes, bed linens, cloth toys, hats, and other personal items in hot water and dry on high heat. Vacuum floors and furniture and throw away the bag afterwards. Keep your child home from day care or school until the morning after treatment for scabies. Notify your child's day care or school so that other children can be checked and treated.  GET HELP RIGHT AWAY IF:  The infected person has a fever, red streaks, pain, or swelling of the skin. Sores get worse or don't heal. New rashes appear or itching continues for more than 2 weeks after treatment.  MAKE SURE YOU:  Understand these instructions. Will watch your condition. Will get help right away if you are not doing well or get worse.  Thank you for choosing an e-visit.  Your e-visit answers  were reviewed by a board certified advanced clinical practitioner to complete your personal care plan. Depending upon the condition, your plan could have included both over the counter or prescription medications.  Please review your pharmacy choice. Make sure the pharmacy is open so you can pick up prescription now. If there is a problem, you may contact your provider through [employer] and have the prescription routed to another pharmacy.  Your safety is important to [place]. If you have drug allergies check your prescription carefully.   For the next 24 hours you can use MyChart to ask questions about today's visit, request a non-urgent call back, or ask for a work or school excuse. You will get an email in the next two days asking about your experience. I hope that your e-visit has been valuable and will speed your recovery.  I provided 5 minutes of non face-to-face time during this encounter for chart review, medication and order placement, as well as and documentation.

## 2022-09-12 ENCOUNTER — Other Ambulatory Visit (HOSPITAL_COMMUNITY): Payer: Self-pay

## 2022-09-12 DIAGNOSIS — M9901 Segmental and somatic dysfunction of cervical region: Secondary | ICD-10-CM | POA: Diagnosis not present

## 2022-09-12 DIAGNOSIS — M9903 Segmental and somatic dysfunction of lumbar region: Secondary | ICD-10-CM | POA: Diagnosis not present

## 2022-09-12 DIAGNOSIS — M9902 Segmental and somatic dysfunction of thoracic region: Secondary | ICD-10-CM | POA: Diagnosis not present

## 2022-09-12 DIAGNOSIS — M9904 Segmental and somatic dysfunction of sacral region: Secondary | ICD-10-CM | POA: Diagnosis not present

## 2022-09-17 ENCOUNTER — Encounter: Payer: Self-pay | Admitting: Physician Assistant

## 2022-09-17 NOTE — Progress Notes (Signed)
For the accuracy of medical information and documentation you must be the actual patient logged in to their own MyChart account to fill out the eVisit symptoms questionnaire.    If you are not the patient, please have the patient log in and complete the eVisit.   Since your son is under 18 he will have to have a video visit to assess. If you have access to his MyChart then you can schedule a video visit there. If not, you will need to go to [email or web address] Continue scheduling and then choose schedule as a guest.   Warmest Regards,  Daiva Nakayama, [place]

## 2022-09-17 NOTE — Telephone Encounter (Signed)
This encounter was created in error - please disregard.

## 2022-09-21 ENCOUNTER — Other Ambulatory Visit (HOSPITAL_COMMUNITY): Payer: Self-pay

## 2022-10-27 ENCOUNTER — Emergency Department (HOSPITAL_COMMUNITY): Payer: 59

## 2022-10-27 ENCOUNTER — Emergency Department (HOSPITAL_COMMUNITY)
Admission: EM | Admit: 2022-10-27 | Discharge: 2022-10-28 | Disposition: A | Discharge status: Home / Self Care | Payer: 59 | Attending: Student | Admitting: Student

## 2022-10-27 DIAGNOSIS — R109 Unspecified abdominal pain: Secondary | ICD-10-CM | POA: Diagnosis not present

## 2022-10-27 DIAGNOSIS — Z041 Encounter for examination and observation following transport accident: Secondary | ICD-10-CM | POA: Diagnosis not present

## 2022-10-27 DIAGNOSIS — M542 Cervicalgia: Secondary | ICD-10-CM | POA: Diagnosis not present

## 2022-10-27 DIAGNOSIS — M545 Low back pain, unspecified: Secondary | ICD-10-CM | POA: Diagnosis not present

## 2022-10-27 DIAGNOSIS — M549 Dorsalgia, unspecified: Secondary | ICD-10-CM | POA: Diagnosis not present

## 2022-10-27 DIAGNOSIS — S32039A Unspecified fracture of third lumbar vertebra, initial encounter for closed fracture: Secondary | ICD-10-CM | POA: Insufficient documentation

## 2022-10-27 DIAGNOSIS — S0990XA Unspecified injury of head, initial encounter: Secondary | ICD-10-CM | POA: Insufficient documentation

## 2022-10-27 DIAGNOSIS — S32009A Unspecified fracture of unspecified lumbar vertebra, initial encounter for closed fracture: Secondary | ICD-10-CM

## 2022-10-27 DIAGNOSIS — S32029A Unspecified fracture of second lumbar vertebra, initial encounter for closed fracture: Secondary | ICD-10-CM | POA: Diagnosis not present

## 2022-10-27 DIAGNOSIS — N9489 Other specified conditions associated with female genital organs and menstrual cycle: Secondary | ICD-10-CM | POA: Diagnosis not present

## 2022-10-27 DIAGNOSIS — S299XXA Unspecified injury of thorax, initial encounter: Secondary | ICD-10-CM | POA: Insufficient documentation

## 2022-10-27 DIAGNOSIS — S3992XA Unspecified injury of lower back, initial encounter: Secondary | ICD-10-CM | POA: Diagnosis present

## 2022-10-27 DIAGNOSIS — Y92410 Unspecified street and highway as the place of occurrence of the external cause: Secondary | ICD-10-CM | POA: Insufficient documentation

## 2022-10-27 DIAGNOSIS — R079 Chest pain, unspecified: Secondary | ICD-10-CM | POA: Diagnosis not present

## 2022-10-27 DIAGNOSIS — S199XXA Unspecified injury of neck, initial encounter: Secondary | ICD-10-CM | POA: Diagnosis not present

## 2022-10-27 LAB — I-STAT CHEM 8, ED
BUN: 11 mg/dL (ref 6–20)
Calcium, Ion: 1.12 mmol/L — ABNORMAL LOW (ref 1.15–1.40)
Chloride: 101 mmol/L (ref 98–111)
Creatinine, Ser: 0.7 mg/dL (ref 0.44–1.00)
Glucose, Bld: 95 mg/dL (ref 70–99)
HCT: 39 % (ref 36.0–46.0)
Hemoglobin: 13.3 g/dL (ref 12.0–15.0)
Potassium: 3 mmol/L — ABNORMAL LOW (ref 3.5–5.1)
Sodium: 140 mmol/L (ref 135–145)
TCO2: 26 mmol/L (ref 22–32)

## 2022-10-27 LAB — I-STAT BETA HCG BLOOD, ED (MC, WL, AP ONLY): I-stat hCG, quantitative: <5 m[IU]/mL (ref <5)

## 2022-10-27 MED ORDER — ONDANSETRON HCL 4 MG/2ML IJ SOLN
4.0000 mg | Freq: Once | INTRAMUSCULAR | Status: AC
  Administered 2022-10-27: 4 mg via INTRAVENOUS
  Filled 2022-10-27: qty 2

## 2022-10-27 MED ORDER — IOHEXOL 350 MG/ML SOLN
75.0000 mL | Freq: Once | INTRAVENOUS | Status: AC | PRN
  Administered 2022-10-27: 75 mL via INTRAVENOUS
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
## 2022-10-27 NOTE — ED Notes (Signed)
C-collar applied

## 2022-10-28 DIAGNOSIS — S0990XA Unspecified injury of head, initial encounter: Secondary | ICD-10-CM | POA: Diagnosis not present

## 2022-10-28 DIAGNOSIS — M542 Cervicalgia: Secondary | ICD-10-CM | POA: Diagnosis not present

## 2022-10-28 DIAGNOSIS — S299XXA Unspecified injury of thorax, initial encounter: Secondary | ICD-10-CM | POA: Diagnosis not present

## 2022-10-28 DIAGNOSIS — S32029A Unspecified fracture of second lumbar vertebra, initial encounter for closed fracture: Secondary | ICD-10-CM | POA: Diagnosis not present

## 2022-10-28 DIAGNOSIS — S32039A Unspecified fracture of third lumbar vertebra, initial encounter for closed fracture: Secondary | ICD-10-CM | POA: Diagnosis not present

## 2022-10-28 DIAGNOSIS — N9489 Other specified conditions associated with female genital organs and menstrual cycle: Secondary | ICD-10-CM | POA: Diagnosis not present

## 2022-10-28 MED ORDER — LIDOCAINE 5 % EX PTCH
2.0000 | MEDICATED_PATCH | CUTANEOUS | Status: DC
  Administered 2022-10-28: 2 via TRANSDERMAL
  Filled 2022-10-28: qty 2

## 2022-10-28 MED ORDER — KETOROLAC TROMETHAMINE 15 MG/ML IJ SOLN
15.0000 mg | Freq: Once | INTRAMUSCULAR | Status: AC
  Administered 2022-10-28: 15 mg via INTRAMUSCULAR
  Filled 2022-10-28: qty 1

## 2022-10-28 MED ORDER — LIDOCAINE 5 % EX PTCH
1.0000 | MEDICATED_PATCH | CUTANEOUS | 0 refills | Status: DC
  Filled 2022-10-28: qty 30, 30d supply, fill #0

## 2022-10-28 MED ORDER — NAPROXEN 375 MG PO TABS
375.0000 mg | ORAL_TABLET | Freq: Two times a day (BID) | ORAL | 0 refills | Status: AC
  Filled 2022-10-28: qty 28, 14d supply, fill #0

## 2022-10-28 MED ORDER — NAPROXEN 250 MG PO TABS
500.0000 mg | ORAL_TABLET | Freq: Once | ORAL | Status: AC
  Administered 2022-10-28: 500 mg via ORAL
  Filled 2022-10-28: qty 2

## 2022-10-28 MED ORDER — ACETAMINOPHEN 500 MG PO TABS
1000.0000 mg | ORAL_TABLET | Freq: Three times a day (TID) | ORAL | 0 refills | Status: DC
  Filled 2022-10-28: qty 30, 5d supply, fill #0

## 2022-10-28 MED ORDER — GABAPENTIN 300 MG PO CAPS: 300.0000 mg | ORAL_CAPSULE | Freq: Three times a day (TID) | ORAL | 0 refills | Status: DC | PRN

## 2022-10-28 NOTE — Progress Notes (Signed)
Orthopedic Tech Progress Note Patient Details:  Tina Gordon Jul 09, 1976 [id number]  Ortho Devices Type of Ortho Device: Lumbar corsett Ortho Device/Splint Interventions: Ordered, Adjustment, Application   Post Interventions Patient Tolerated: Well Instructions Provided: Care of device, Adjustment of device  Trinna Post 10/28/2022, 3:52 AM
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
## 2022-10-29 ENCOUNTER — Other Ambulatory Visit: Payer: Self-pay

## 2022-10-30 ENCOUNTER — Other Ambulatory Visit (HOSPITAL_COMMUNITY): Payer: Self-pay

## 2022-11-05 DIAGNOSIS — S32009D Unspecified fracture of unspecified lumbar vertebra, subsequent encounter for fracture with routine healing: Secondary | ICD-10-CM | POA: Diagnosis not present

## 2022-11-05 DIAGNOSIS — M545 Low back pain, unspecified: Secondary | ICD-10-CM | POA: Diagnosis not present

## 2022-11-05 DIAGNOSIS — R0789 Other chest pain: Secondary | ICD-10-CM | POA: Diagnosis not present

## 2022-11-14 DIAGNOSIS — M545 Low back pain, unspecified: Secondary | ICD-10-CM | POA: Diagnosis not present

## 2022-12-01 DIAGNOSIS — M545 Low back pain, unspecified: Secondary | ICD-10-CM | POA: Diagnosis not present

## 2022-12-03 DIAGNOSIS — M545 Low back pain, unspecified: Secondary | ICD-10-CM | POA: Diagnosis not present

## 2022-12-27 DIAGNOSIS — M545 Low back pain, unspecified: Secondary | ICD-10-CM | POA: Diagnosis not present

## 2023-01-07 DIAGNOSIS — M545 Low back pain, unspecified: Secondary | ICD-10-CM | POA: Diagnosis not present

## 2023-01-07 DIAGNOSIS — M6281 Muscle weakness (generalized): Secondary | ICD-10-CM | POA: Diagnosis not present

## 2023-01-07 DIAGNOSIS — S32008D Other fracture of unspecified lumbar vertebra, subsequent encounter for fracture with routine healing: Secondary | ICD-10-CM | POA: Diagnosis not present

## 2023-01-08 DIAGNOSIS — M6281 Muscle weakness (generalized): Secondary | ICD-10-CM | POA: Diagnosis not present

## 2023-01-08 DIAGNOSIS — M545 Low back pain, unspecified: Secondary | ICD-10-CM | POA: Diagnosis not present

## 2023-01-08 DIAGNOSIS — S32008D Other fracture of unspecified lumbar vertebra, subsequent encounter for fracture with routine healing: Secondary | ICD-10-CM | POA: Diagnosis not present

## 2023-01-15 DIAGNOSIS — S32008D Other fracture of unspecified lumbar vertebra, subsequent encounter for fracture with routine healing: Secondary | ICD-10-CM | POA: Diagnosis not present

## 2023-01-15 DIAGNOSIS — M545 Low back pain, unspecified: Secondary | ICD-10-CM | POA: Diagnosis not present

## 2023-01-15 DIAGNOSIS — M6281 Muscle weakness (generalized): Secondary | ICD-10-CM | POA: Diagnosis not present

## 2023-01-17 DIAGNOSIS — M6281 Muscle weakness (generalized): Secondary | ICD-10-CM | POA: Diagnosis not present

## 2023-01-17 DIAGNOSIS — S32008D Other fracture of unspecified lumbar vertebra, subsequent encounter for fracture with routine healing: Secondary | ICD-10-CM | POA: Diagnosis not present

## 2023-01-17 DIAGNOSIS — M545 Low back pain, unspecified: Secondary | ICD-10-CM | POA: Diagnosis not present

## 2023-01-21 DIAGNOSIS — M545 Low back pain, unspecified: Secondary | ICD-10-CM | POA: Diagnosis not present

## 2023-01-21 DIAGNOSIS — S32008D Other fracture of unspecified lumbar vertebra, subsequent encounter for fracture with routine healing: Secondary | ICD-10-CM | POA: Diagnosis not present

## 2023-01-21 DIAGNOSIS — M6281 Muscle weakness (generalized): Secondary | ICD-10-CM | POA: Diagnosis not present

## 2023-01-24 DIAGNOSIS — S32008D Other fracture of unspecified lumbar vertebra, subsequent encounter for fracture with routine healing: Secondary | ICD-10-CM | POA: Diagnosis not present

## 2023-01-24 DIAGNOSIS — M6281 Muscle weakness (generalized): Secondary | ICD-10-CM | POA: Diagnosis not present

## 2023-01-24 DIAGNOSIS — M545 Low back pain, unspecified: Secondary | ICD-10-CM | POA: Diagnosis not present

## 2023-01-28 DIAGNOSIS — M545 Low back pain, unspecified: Secondary | ICD-10-CM | POA: Diagnosis not present

## 2023-01-28 DIAGNOSIS — S32008D Other fracture of unspecified lumbar vertebra, subsequent encounter for fracture with routine healing: Secondary | ICD-10-CM | POA: Diagnosis not present

## 2023-01-28 DIAGNOSIS — M6281 Muscle weakness (generalized): Secondary | ICD-10-CM | POA: Diagnosis not present

## 2023-01-30 DIAGNOSIS — M6281 Muscle weakness (generalized): Secondary | ICD-10-CM | POA: Diagnosis not present

## 2023-01-30 DIAGNOSIS — S32008D Other fracture of unspecified lumbar vertebra, subsequent encounter for fracture with routine healing: Secondary | ICD-10-CM | POA: Diagnosis not present

## 2023-01-30 DIAGNOSIS — M545 Low back pain, unspecified: Secondary | ICD-10-CM | POA: Diagnosis not present

## 2023-02-06 DIAGNOSIS — M9903 Segmental and somatic dysfunction of lumbar region: Secondary | ICD-10-CM | POA: Diagnosis not present

## 2023-02-06 DIAGNOSIS — M545 Low back pain, unspecified: Secondary | ICD-10-CM | POA: Diagnosis not present

## 2023-02-06 DIAGNOSIS — M9902 Segmental and somatic dysfunction of thoracic region: Secondary | ICD-10-CM | POA: Diagnosis not present

## 2023-02-06 DIAGNOSIS — S32008D Other fracture of unspecified lumbar vertebra, subsequent encounter for fracture with routine healing: Secondary | ICD-10-CM | POA: Diagnosis not present

## 2023-02-06 DIAGNOSIS — M6281 Muscle weakness (generalized): Secondary | ICD-10-CM | POA: Diagnosis not present

## 2023-02-06 DIAGNOSIS — M9901 Segmental and somatic dysfunction of cervical region: Secondary | ICD-10-CM | POA: Diagnosis not present

## 2023-02-06 DIAGNOSIS — M9904 Segmental and somatic dysfunction of sacral region: Secondary | ICD-10-CM | POA: Diagnosis not present

## 2023-03-18 ENCOUNTER — Other Ambulatory Visit (HOSPITAL_COMMUNITY): Payer: Self-pay

## 2023-03-18 DIAGNOSIS — R3915 Urgency of urination: Secondary | ICD-10-CM | POA: Diagnosis not present

## 2023-03-18 MED ORDER — NITROFURANTOIN MONOHYD MACRO 100 MG PO CAPS
100.0000 mg | ORAL_CAPSULE | Freq: Two times a day (BID) | ORAL | 0 refills | Status: DC
  Filled 2023-03-18: qty 10, 5d supply, fill #0

## 2023-04-29 ENCOUNTER — Other Ambulatory Visit (HOSPITAL_COMMUNITY): Payer: Self-pay

## 2023-04-29 MED ORDER — MIRABEGRON ER 25 MG PO TB24
25.0000 mg | ORAL_TABLET | Freq: Every day | ORAL | 0 refills | Status: DC
  Filled 2023-04-29: qty 90, 90d supply, fill #0

## 2023-05-27 ENCOUNTER — Other Ambulatory Visit (HOSPITAL_COMMUNITY): Payer: Self-pay

## 2023-05-29 DIAGNOSIS — E538 Deficiency of other specified B group vitamins: Secondary | ICD-10-CM | POA: Diagnosis not present

## 2023-05-29 DIAGNOSIS — Z6832 Body mass index (BMI) 32.0-32.9, adult: Secondary | ICD-10-CM | POA: Diagnosis not present

## 2023-05-29 DIAGNOSIS — K582 Mixed irritable bowel syndrome: Secondary | ICD-10-CM | POA: Diagnosis not present

## 2023-05-29 DIAGNOSIS — E785 Hyperlipidemia, unspecified: Secondary | ICD-10-CM | POA: Diagnosis not present

## 2023-05-29 DIAGNOSIS — J309 Allergic rhinitis, unspecified: Secondary | ICD-10-CM | POA: Diagnosis not present

## 2023-05-29 DIAGNOSIS — J45990 Exercise induced bronchospasm: Secondary | ICD-10-CM | POA: Diagnosis not present

## 2023-05-29 DIAGNOSIS — E669 Obesity, unspecified: Secondary | ICD-10-CM | POA: Diagnosis not present

## 2023-05-29 DIAGNOSIS — Z0000 Encounter for general adult medical examination without abnormal findings: Secondary | ICD-10-CM | POA: Diagnosis not present

## 2023-05-29 DIAGNOSIS — K219 Gastro-esophageal reflux disease without esophagitis: Secondary | ICD-10-CM | POA: Diagnosis not present

## 2023-05-29 DIAGNOSIS — N393 Stress incontinence (female) (male): Secondary | ICD-10-CM | POA: Diagnosis not present

## 2023-06-04 ENCOUNTER — Other Ambulatory Visit (HOSPITAL_COMMUNITY): Payer: Self-pay

## 2023-06-04 MED ORDER — OMEPRAZOLE 40 MG PO CPDR
40.0000 mg | DELAYED_RELEASE_CAPSULE | Freq: Every day | ORAL | 1 refills | Status: DC
  Filled 2023-06-04: qty 90, 90d supply, fill #0
  Filled 2023-09-02: qty 90, 90d supply, fill #1

## 2023-06-20 ENCOUNTER — Other Ambulatory Visit (HOSPITAL_COMMUNITY): Payer: Self-pay

## 2023-06-20 DIAGNOSIS — R946 Abnormal results of thyroid function studies: Secondary | ICD-10-CM | POA: Diagnosis not present

## 2023-06-20 DIAGNOSIS — K219 Gastro-esophageal reflux disease without esophagitis: Secondary | ICD-10-CM | POA: Diagnosis not present

## 2023-06-20 DIAGNOSIS — Z1231 Encounter for screening mammogram for malignant neoplasm of breast: Secondary | ICD-10-CM | POA: Diagnosis not present

## 2023-06-20 DIAGNOSIS — Z01419 Encounter for gynecological examination (general) (routine) without abnormal findings: Secondary | ICD-10-CM | POA: Diagnosis not present

## 2023-06-20 DIAGNOSIS — Z113 Encounter for screening for infections with a predominantly sexual mode of transmission: Secondary | ICD-10-CM | POA: Diagnosis not present

## 2023-06-20 DIAGNOSIS — Z01411 Encounter for gynecological examination (general) (routine) with abnormal findings: Secondary | ICD-10-CM | POA: Diagnosis not present

## 2023-06-20 DIAGNOSIS — Z124 Encounter for screening for malignant neoplasm of cervix: Secondary | ICD-10-CM | POA: Diagnosis not present

## 2023-06-20 MED ORDER — MIRABEGRON ER 25 MG PO TB24
25.0000 mg | ORAL_TABLET | Freq: Every day | ORAL | 3 refills | Status: DC
  Filled 2023-06-20: qty 90, 90d supply, fill #0

## 2023-06-24 ENCOUNTER — Other Ambulatory Visit (HOSPITAL_COMMUNITY): Payer: Self-pay

## 2023-06-24 ENCOUNTER — Other Ambulatory Visit: Payer: Self-pay | Admitting: Obstetrics and Gynecology

## 2023-06-24 ENCOUNTER — Other Ambulatory Visit: Payer: Self-pay | Admitting: Pain Medicine

## 2023-06-24 DIAGNOSIS — E0590 Thyrotoxicosis, unspecified without thyrotoxic crisis or storm: Secondary | ICD-10-CM

## 2023-06-24 DIAGNOSIS — R928 Other abnormal and inconclusive findings on diagnostic imaging of breast: Secondary | ICD-10-CM

## 2023-06-24 MED ORDER — PROPRANOLOL HCL 10 MG PO TABS
10.0000 mg | ORAL_TABLET | Freq: Every day | ORAL | 1 refills | Status: DC
  Filled 2023-06-24: qty 30, 30d supply, fill #0

## 2023-07-03 ENCOUNTER — Ambulatory Visit
Admission: RE | Admit: 2023-07-03 | Discharge: 2023-07-03 | Disposition: A | Discharge status: Home / Self Care | Payer: Commercial Managed Care - PPO | Source: Ambulatory Visit | Attending: Pain Medicine | Admitting: Pain Medicine

## 2023-07-03 DIAGNOSIS — E0500 Thyrotoxicosis with diffuse goiter without thyrotoxic crisis or storm: Secondary | ICD-10-CM | POA: Diagnosis not present

## 2023-07-03 DIAGNOSIS — E0590 Thyrotoxicosis, unspecified without thyrotoxic crisis or storm: Secondary | ICD-10-CM

## 2023-07-08 DIAGNOSIS — Z1211 Encounter for screening for malignant neoplasm of colon: Secondary | ICD-10-CM | POA: Diagnosis not present

## 2023-07-08 DIAGNOSIS — Z1212 Encounter for screening for malignant neoplasm of rectum: Secondary | ICD-10-CM | POA: Diagnosis not present

## 2023-07-10 ENCOUNTER — Other Ambulatory Visit (HOSPITAL_COMMUNITY): Payer: Self-pay

## 2023-07-10 DIAGNOSIS — E0590 Thyrotoxicosis, unspecified without thyrotoxic crisis or storm: Secondary | ICD-10-CM | POA: Diagnosis not present

## 2023-07-10 MED ORDER — METHIMAZOLE 10 MG PO TABS
10.0000 mg | ORAL_TABLET | Freq: Every day | ORAL | 1 refills | Status: DC
  Filled 2023-07-10: qty 30, 30d supply, fill #0
  Filled 2023-08-05: qty 30, 30d supply, fill #1

## 2023-07-11 ENCOUNTER — Other Ambulatory Visit (HOSPITAL_COMMUNITY): Payer: Self-pay

## 2023-07-14 LAB — EXTERNAL GENERIC LAB PROCEDURE: COLOGUARD: NEGATIVE

## 2023-07-14 LAB — COLOGUARD: COLOGUARD: NEGATIVE

## 2023-07-16 ENCOUNTER — Ambulatory Visit
Admission: RE | Admit: 2023-07-16 | Discharge: 2023-07-16 | Disposition: A | Discharge status: Home / Self Care | Payer: Commercial Managed Care - PPO | Source: Ambulatory Visit | Attending: Obstetrics and Gynecology | Admitting: Obstetrics and Gynecology

## 2023-07-16 DIAGNOSIS — R928 Other abnormal and inconclusive findings on diagnostic imaging of breast: Secondary | ICD-10-CM

## 2023-07-16 DIAGNOSIS — R921 Mammographic calcification found on diagnostic imaging of breast: Secondary | ICD-10-CM | POA: Diagnosis not present

## 2023-07-19 ENCOUNTER — Other Ambulatory Visit (HOSPITAL_COMMUNITY): Payer: Self-pay

## 2023-07-19 MED ORDER — MIRABEGRON ER 50 MG PO TB24
50.0000 mg | ORAL_TABLET | Freq: Every day | ORAL | 3 refills | Status: AC
  Filled 2023-07-19: qty 90, 90d supply, fill #0
  Filled 2023-10-29 – 2023-11-28 (×3): qty 90, 90d supply, fill #1
  Filled 2024-02-27: qty 90, 90d supply, fill #2
  Filled 2024-06-21: qty 90, 90d supply, fill #3

## 2023-07-23 ENCOUNTER — Other Ambulatory Visit (HOSPITAL_COMMUNITY): Payer: Self-pay

## 2023-07-23 ENCOUNTER — Telehealth: Payer: Commercial Managed Care - PPO | Admitting: Physician Assistant

## 2023-07-23 DIAGNOSIS — B9689 Other specified bacterial agents as the cause of diseases classified elsewhere: Secondary | ICD-10-CM

## 2023-07-23 DIAGNOSIS — J019 Acute sinusitis, unspecified: Secondary | ICD-10-CM | POA: Diagnosis not present

## 2023-07-23 MED ORDER — AMOXICILLIN-POT CLAVULANATE 875-125 MG PO TABS
1.0000 | ORAL_TABLET | Freq: Two times a day (BID) | ORAL | 0 refills | Status: DC
  Filled 2023-07-23: qty 14, 7d supply, fill #0

## 2023-07-23 NOTE — Progress Notes (Signed)
I have spent 5 minutes in review of e-visit questionnaire, review and updating patient chart, medical decision making and response to patient.   Mia Milan Cody Jacklynn Dehaas, PA-C    

## 2023-07-23 NOTE — Progress Notes (Signed)

## 2023-08-26 DIAGNOSIS — Z6831 Body mass index (BMI) 31.0-31.9, adult: Secondary | ICD-10-CM | POA: Diagnosis not present

## 2023-08-26 DIAGNOSIS — E0590 Thyrotoxicosis, unspecified without thyrotoxic crisis or storm: Secondary | ICD-10-CM | POA: Diagnosis not present

## 2023-09-02 ENCOUNTER — Other Ambulatory Visit (HOSPITAL_COMMUNITY): Payer: Self-pay

## 2023-09-02 ENCOUNTER — Other Ambulatory Visit: Payer: Self-pay

## 2023-09-04 ENCOUNTER — Other Ambulatory Visit (HOSPITAL_COMMUNITY): Payer: Self-pay

## 2023-09-04 MED ORDER — METHIMAZOLE 10 MG PO TABS
10.0000 mg | ORAL_TABLET | Freq: Every day | ORAL | 0 refills | Status: DC
  Filled 2023-09-04: qty 30, 30d supply, fill #0

## 2023-09-06 DIAGNOSIS — R7989 Other specified abnormal findings of blood chemistry: Secondary | ICD-10-CM | POA: Diagnosis not present

## 2023-09-06 DIAGNOSIS — R946 Abnormal results of thyroid function studies: Secondary | ICD-10-CM | POA: Diagnosis not present

## 2023-09-18 DIAGNOSIS — R5383 Other fatigue: Secondary | ICD-10-CM | POA: Diagnosis not present

## 2023-09-18 DIAGNOSIS — Z6832 Body mass index (BMI) 32.0-32.9, adult: Secondary | ICD-10-CM | POA: Diagnosis not present

## 2023-09-18 DIAGNOSIS — E0590 Thyrotoxicosis, unspecified without thyrotoxic crisis or storm: Secondary | ICD-10-CM | POA: Diagnosis not present

## 2023-09-18 DIAGNOSIS — R0683 Snoring: Secondary | ICD-10-CM | POA: Diagnosis not present

## 2023-10-10 DIAGNOSIS — R768 Other specified abnormal immunological findings in serum: Secondary | ICD-10-CM | POA: Diagnosis not present

## 2023-10-10 DIAGNOSIS — E0590 Thyrotoxicosis, unspecified without thyrotoxic crisis or storm: Secondary | ICD-10-CM | POA: Diagnosis not present

## 2023-10-10 DIAGNOSIS — E669 Obesity, unspecified: Secondary | ICD-10-CM | POA: Diagnosis not present

## 2023-10-10 DIAGNOSIS — E538 Deficiency of other specified B group vitamins: Secondary | ICD-10-CM | POA: Diagnosis not present

## 2023-10-10 DIAGNOSIS — E063 Autoimmune thyroiditis: Secondary | ICD-10-CM | POA: Diagnosis not present

## 2023-10-10 DIAGNOSIS — K582 Mixed irritable bowel syndrome: Secondary | ICD-10-CM | POA: Diagnosis not present

## 2023-10-10 DIAGNOSIS — E785 Hyperlipidemia, unspecified: Secondary | ICD-10-CM | POA: Diagnosis not present

## 2023-10-10 DIAGNOSIS — M2550 Pain in unspecified joint: Secondary | ICD-10-CM | POA: Diagnosis not present

## 2023-11-08 ENCOUNTER — Other Ambulatory Visit (HOSPITAL_COMMUNITY): Payer: Self-pay

## 2023-11-08 ENCOUNTER — Other Ambulatory Visit (HOSPITAL_BASED_OUTPATIENT_CLINIC_OR_DEPARTMENT_OTHER): Payer: Self-pay

## 2023-11-08 DIAGNOSIS — M25562 Pain in left knee: Secondary | ICD-10-CM | POA: Diagnosis not present

## 2023-11-08 DIAGNOSIS — G8929 Other chronic pain: Secondary | ICD-10-CM | POA: Diagnosis not present

## 2023-11-08 DIAGNOSIS — R768 Other specified abnormal immunological findings in serum: Secondary | ICD-10-CM | POA: Diagnosis not present

## 2023-11-08 DIAGNOSIS — E669 Obesity, unspecified: Secondary | ICD-10-CM | POA: Diagnosis not present

## 2023-11-08 DIAGNOSIS — M25561 Pain in right knee: Secondary | ICD-10-CM | POA: Diagnosis not present

## 2023-11-08 DIAGNOSIS — Z6830 Body mass index (BMI) 30.0-30.9, adult: Secondary | ICD-10-CM | POA: Diagnosis not present

## 2023-11-08 MED ORDER — MELOXICAM 15 MG PO TABS
15.0000 mg | ORAL_TABLET | Freq: Every day | ORAL | 0 refills | Status: DC
  Filled 2023-11-08 – 2023-11-11 (×2): qty 30, 30d supply, fill #0

## 2023-11-11 ENCOUNTER — Other Ambulatory Visit (HOSPITAL_BASED_OUTPATIENT_CLINIC_OR_DEPARTMENT_OTHER): Payer: Self-pay

## 2023-11-11 ENCOUNTER — Other Ambulatory Visit (HOSPITAL_COMMUNITY): Payer: Self-pay

## 2023-11-14 ENCOUNTER — Other Ambulatory Visit (HOSPITAL_BASED_OUTPATIENT_CLINIC_OR_DEPARTMENT_OTHER): Payer: Self-pay

## 2023-11-26 ENCOUNTER — Other Ambulatory Visit (HOSPITAL_COMMUNITY): Payer: Self-pay

## 2023-11-28 ENCOUNTER — Other Ambulatory Visit (HOSPITAL_COMMUNITY): Payer: Self-pay

## 2023-11-28 ENCOUNTER — Other Ambulatory Visit: Payer: Self-pay

## 2023-11-28 MED ORDER — OMEPRAZOLE 40 MG PO CPDR
40.0000 mg | DELAYED_RELEASE_CAPSULE | Freq: Every day | ORAL | 1 refills | Status: AC
  Filled 2023-11-28: qty 90, 90d supply, fill #0
  Filled 2024-06-21: qty 90, 90d supply, fill #1

## 2023-12-03 ENCOUNTER — Other Ambulatory Visit (HOSPITAL_COMMUNITY): Payer: Self-pay

## 2023-12-05 DIAGNOSIS — R768 Other specified abnormal immunological findings in serum: Secondary | ICD-10-CM | POA: Diagnosis not present

## 2023-12-05 DIAGNOSIS — Z1589 Genetic susceptibility to other disease: Secondary | ICD-10-CM | POA: Diagnosis not present

## 2023-12-05 DIAGNOSIS — M25561 Pain in right knee: Secondary | ICD-10-CM | POA: Diagnosis not present

## 2023-12-05 DIAGNOSIS — E669 Obesity, unspecified: Secondary | ICD-10-CM | POA: Diagnosis not present

## 2023-12-05 DIAGNOSIS — Z6830 Body mass index (BMI) 30.0-30.9, adult: Secondary | ICD-10-CM | POA: Diagnosis not present

## 2023-12-05 DIAGNOSIS — M25562 Pain in left knee: Secondary | ICD-10-CM | POA: Diagnosis not present

## 2024-02-06 DIAGNOSIS — E063 Autoimmune thyroiditis: Secondary | ICD-10-CM | POA: Diagnosis not present

## 2024-02-06 DIAGNOSIS — E669 Obesity, unspecified: Secondary | ICD-10-CM | POA: Diagnosis not present

## 2024-02-06 DIAGNOSIS — E785 Hyperlipidemia, unspecified: Secondary | ICD-10-CM | POA: Diagnosis not present

## 2024-02-06 DIAGNOSIS — E0590 Thyrotoxicosis, unspecified without thyrotoxic crisis or storm: Secondary | ICD-10-CM | POA: Diagnosis not present

## 2024-02-06 DIAGNOSIS — M791 Myalgia, unspecified site: Secondary | ICD-10-CM | POA: Diagnosis not present

## 2024-03-10 ENCOUNTER — Other Ambulatory Visit (HOSPITAL_COMMUNITY): Payer: Self-pay

## 2024-03-10 DIAGNOSIS — M25562 Pain in left knee: Secondary | ICD-10-CM | POA: Diagnosis not present

## 2024-03-10 MED ORDER — NAPROXEN 500 MG PO TABS
500.0000 mg | ORAL_TABLET | Freq: Two times a day (BID) | ORAL | 2 refills | Status: DC | PRN
  Filled 2024-03-10: qty 60, 30d supply, fill #0

## 2024-03-23 ENCOUNTER — Telehealth: Admitting: Family Medicine

## 2024-03-23 DIAGNOSIS — J208 Acute bronchitis due to other specified organisms: Secondary | ICD-10-CM

## 2024-03-23 DIAGNOSIS — B9689 Other specified bacterial agents as the cause of diseases classified elsewhere: Secondary | ICD-10-CM | POA: Diagnosis not present

## 2024-03-23 MED ORDER — ALBUTEROL SULFATE HFA 108 (90 BASE) MCG/ACT IN AERS: 1.0000 | INHALATION_SPRAY | Freq: Four times a day (QID) | RESPIRATORY_TRACT | 0 refills | Status: DC | PRN

## 2024-03-23 MED ORDER — AZITHROMYCIN 250 MG PO TABS: ORAL_TABLET | ORAL | 0 refills | Status: AC

## 2024-03-23 MED ORDER — BENZONATATE 100 MG PO CAPS: 100.0000 mg | ORAL_CAPSULE | Freq: Three times a day (TID) | ORAL | 0 refills | Status: DC | PRN

## 2024-03-23 NOTE — Progress Notes (Signed)
 E-Visit for Cough   We are sorry that you are not feeling well.  Here is how we plan to help!  Based on your presentation I believe you most likely have A cough due to bacteria.  When patients have a fever and a productive cough with a change in color or increased sputum production, we are concerned about bacterial bronchitis.  If left untreated it can progress to pneumonia.  If your symptoms do not improve with your treatment plan it is important that you contact your provider.   I have prescribed Azithromyin 250 mg: two tablets now and then one tablet daily for 4 additonal days    In addition you may use A non-prescription cough medication called Mucinex DM: take 2 tablets every 12 hours. and A prescription cough medication called Tessalon Perles 100mg . You may take 1-2 capsules every 8 hours as needed for your cough.  I have also prescribed Albuterol inhaler Use 1-2 puffs every 6 hours as needed for shortness of breath, chest tightness, and/or wheezing.  From your responses in the eVisit questionnaire you describe inflammation in the upper respiratory tract which is causing a significant cough.  This is commonly called Bronchitis and has four common causes:   Allergies Viral Infections Acid Reflux Bacterial Infection Allergies, viruses and acid reflux are treated by controlling symptoms or eliminating the cause. An example might be a cough caused by taking certain blood pressure medications. You stop the cough by changing the medication. Another example might be a cough caused by acid reflux. Controlling the reflux helps control the cough.  USE OF BRONCHODILATOR ("RESCUE") INHALERS: There is a risk from using your bronchodilator too frequently.  The risk is that over-reliance on a medication which only relaxes the muscles surrounding the breathing tubes can reduce the effectiveness of medications prescribed to reduce swelling and congestion of the tubes themselves.  Although you feel brief  relief from the bronchodilator inhaler, your asthma may actually be worsening with the tubes becoming more swollen and filled with mucus.  This can delay other crucial treatments, such as oral steroid medications. If you need to use a bronchodilator inhaler daily, several times per day, you should discuss this with your provider.  There are probably better treatments that could be used to keep your asthma under control.     HOME CARE Only take medications as instructed by your medical team. Complete the entire course of an antibiotic. Drink plenty of fluids and get plenty of rest. Avoid close contacts especially the very young and the elderly Cover your mouth if you cough or cough into your sleeve. Always remember to wash your hands A steam or ultrasonic humidifier can help congestion.   GET HELP RIGHT AWAY IF: You develop worsening fever. You become short of breath You cough up blood. Your symptoms persist after you have completed your treatment plan MAKE SURE YOU  Understand these instructions. Will watch your condition. Will get help right away if you are not doing well or get worse.    Thank you for choosing an e-visit.  Your e-visit answers were reviewed by a board certified advanced clinical practitioner to complete your personal care plan. Depending upon the condition, your plan could have included both over the counter or prescription medications.  Please review your pharmacy choice. Make sure the pharmacy is open so you can pick up prescription now. If there is a problem, you may contact your provider through MyChart messaging and have the prescription routed to another  pharmacy.  Your safety is important to [place]. If you have drug allergies check your prescription carefully.   For the next 24 hours you can use MyChart to ask questions about today's visit, request a non-urgent call back, or ask for a work or school excuse. You will get an email in the next two days asking about  your experience. I hope that your e-visit has been valuable and will speed your recovery.   I have spent 5 minutes in review of e-visit questionnaire, review and updating patient chart, medical decision making and response to patient.   Margaretann Loveless, PA-C

## 2024-04-08 DIAGNOSIS — M25562 Pain in left knee: Secondary | ICD-10-CM | POA: Diagnosis not present

## 2024-04-08 DIAGNOSIS — Z1589 Genetic susceptibility to other disease: Secondary | ICD-10-CM | POA: Diagnosis not present

## 2024-04-08 DIAGNOSIS — E669 Obesity, unspecified: Secondary | ICD-10-CM | POA: Diagnosis not present

## 2024-04-08 DIAGNOSIS — R768 Other specified abnormal immunological findings in serum: Secondary | ICD-10-CM | POA: Diagnosis not present

## 2024-04-08 DIAGNOSIS — M25561 Pain in right knee: Secondary | ICD-10-CM | POA: Diagnosis not present

## 2024-04-08 DIAGNOSIS — Z6830 Body mass index (BMI) 30.0-30.9, adult: Secondary | ICD-10-CM | POA: Diagnosis not present

## 2024-04-24 DIAGNOSIS — Z6831 Body mass index (BMI) 31.0-31.9, adult: Secondary | ICD-10-CM | POA: Diagnosis not present

## 2024-04-24 DIAGNOSIS — R002 Palpitations: Secondary | ICD-10-CM | POA: Diagnosis not present

## 2024-05-08 ENCOUNTER — Other Ambulatory Visit: Payer: Self-pay

## 2024-05-08 DIAGNOSIS — R002 Palpitations: Secondary | ICD-10-CM | POA: Insufficient documentation

## 2024-05-11 ENCOUNTER — Ambulatory Visit

## 2024-05-11 VITALS — BP 134/86 | HR 69 | Ht 66.0 in | Wt 197.0 lb

## 2024-05-11 DIAGNOSIS — R011 Cardiac murmur, unspecified: Secondary | ICD-10-CM | POA: Insufficient documentation

## 2024-05-11 DIAGNOSIS — R002 Palpitations: Secondary | ICD-10-CM

## 2024-05-11 NOTE — Assessment & Plan Note (Addendum)
 Palpitations as reviewed under HPI.  No significant abnormalities on EKG today. Will review Zio patch results once available as completed and returned through her PCP's office.  Advised to avoid potential stimulants, avoid dehydration. Evaluate for any potential sleep apnea after discussing with PCP given her history of snoring and disturbed sleep and not feeling refreshed in the morning after waking up.  Advised to avoid stimulants, moderate or abstain from alcohol.  No further episodes occurred while she was working outdoors spraying weed killer around the pond in the yard.  Will obtain a treadmill EKG stress to rule out any significant exertional tachyarrhythmias.  Will obtain transthoracic echocardiogram to rule out any significant cardiac structural and functional abnormalities given the palpitations and heart murmur noted.  Ongoing thyrotoxicosis evaluation as per primary team.

## 2024-05-11 NOTE — Patient Instructions (Signed)
 Medication Instructions:  Your physician recommends that you continue on your current medications as directed. Please refer to the Current Medication list given to you today.  *If you need a refill on your cardiac medications before your next appointment, please call your pharmacy*  Lab Work: None If you have labs (blood work) drawn today and your tests are completely normal, you will receive your results only by: MyChart Message (if you have MyChart) OR A paper copy in the mail If you have any lab test that is abnormal or we need to change your treatment, we will call you to review the results.  Testing/Procedures: Your physician has requested that you have an echocardiogram. Echocardiography is a painless test that uses sound waves to create images of your heart. It provides your doctor with information about the size and shape of your heart and how well your heart's chambers and valves are working. This procedure takes approximately one hour. There are no restrictions for this procedure. Please do NOT wear cologne, perfume, aftershave, or lotions (deodorant is allowed). Please arrive 15 minutes prior to your appointment time.  Please note: We ask at that you not bring children with you during ultrasound (echo/ vascular) testing. Due to room size and safety concerns, children are not allowed in the ultrasound rooms during exams. Our front office staff cannot provide observation of children in our lobby area while testing is being conducted. An adult accompanying a patient to their appointment will only be allowed in the ultrasound room at the discretion of the ultrasound technician under special circumstances. We apologize for any inconvenience.   Treadmill Stress Test Instructions:    1. You may take all of your medications.  2. No food, drink or tobacco products 2 hours prior to your test.  3. Dress prepared to exercise. Best to wear 2 piece outfit and tennis shoes. Shoes must be closed  toe.  4. Please bring all current prescription medications.   Follow-Up: At [facility], you and your health needs are our priority.  As part of our continuing mission to provide you with exceptional heart care, our providers are all part of one team.  This team includes your primary Cardiologist (physician) and Advanced Practice Providers or APPs (Physician Assistants and Nurse Practitioners) who all work together to provide you with the care you need, when you need it.  Your next appointment:   Follow up to be determined based on testing  Provider:   Alean Kobus, MD    We recommend signing up for the patient portal called MyChart.  Sign up information is provided on this After Visit Summary.  MyChart is used to connect with patients for Virtual Visits (Telemedicine).  Patients are able to view lab/test results, encounter notes, upcoming appointments, etc.  Non-urgent messages can be sent to your provider as well.   To learn more about what you can do with MyChart, go to [email or web address].   Other Instructions None

## 2024-05-11 NOTE — Progress Notes (Addendum)
 Cardiology Consultation:    Date:  05/11/2024   ID:  Tina Gordon, DOB Dec 31, 1975, MRN [id number]  PCP:  Tina Planas, MD  Cardiologist:  Tina JONELLE Milanna Kozlov, MD   Referring MD: Tina Planas, MD   No chief complaint on file.    ASSESSMENT AND PLAN:   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  No syncopal episodes. Recently had Zio patch done through her PCP and returned it last week, results pending.   Problem List Items Addressed This Visit     Palpitations - Primary   Palpitations as reviewed under HPI.  No significant abnormalities on EKG today. Will review Zio patch results once available as completed and returned through her PCP's office.  Advised to avoid potential stimulants, avoid dehydration. Evaluate for any potential sleep apnea after discussing with PCP given her history of snoring and disturbed sleep and not feeling refreshed in the morning after waking up.  Advised to avoid stimulants, moderate or abstain from alcohol.  No further episodes occurred while she was working outdoors spraying weed killer around the pond in the yard.  Will obtain a treadmill EKG stress to rule out any significant exertional tachyarrhythmias.  Will obtain transthoracic echocardiogram to rule out any significant cardiac structural and functional abnormalities given the palpitations and heart murmur noted.  Ongoing thyrotoxicosis evaluation as per primary team.        Relevant Orders   EKG 12-Lead (Completed)   ECHOCARDIOGRAM COMPLETE   Exercise Tolerance Test   Cardiac Stress Test: Informed Consent Details: Physician/Practitioner Attestation; Transcribe to consent form  and obtain patient signature   Systolic murmur   Likely an innocent flow murmur.  Will obtain transthoracic echocardiogram to rule out any significant valvular abnormalities.      Return to clinic based on test results.  Addendum 05-25-2024: Zio patch results available from PCPs office 14 days study 04/24/2024 through 05/07/2024 Noted predominantly sinus rhythm with average heart rate 77/min [ranging from 45 to 148 bpm].  Rare ventricular and supraventricular ectopy burden, less than 1% There were 49 instances of SVT with the longest episode lasting 15 beats. Type I second-degree AV block was noted. There were couple instances of AV block with nonconducted P waves noted with nonsignificant pauses [on 04/28/2024 and 05/05/2024, cannot exclude type II second-degree AV block versus nonconducted ectopic atrial beats. There were 53 patient triggered events observed was correlated with sinus rhythm and isolated supraventricular ectopic beats.  Occasionally correlated with isolated ventricular ectopy and short runs of SVT.  - Will recommend she get evaluated for sleep apnea. - Will recommend evaluation with electrophysiologist, please schedule her for a visit with EP Dr. Inocencio to review event monitor results and discussing if any further extended monitoring would be recommended.   History of Present Illness:    Tina Gordon is a 48 y.o. female who is being seen today for the evaluation of palpitations at the request of Tina Planas, MD.   Pleasant woman here for the visit by herself.  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  Has no significant longstanding health issues.  Has had exposure to secondhand smoking as a child.  Has exercise-induced asthma  no recent flareups.  Is being monitored by PCP for thyroid  panel for potential thyrotoxicosis [last TSH available to review is from 02/06/2024 with TSH 0.8].  Here for cardiac evaluation in  the setting of palpitations and near syncopal episodes. Describes episodes of near syncope 1 in January the other in May and since May had 3 more episodes which were brief.  Most prominent episode was in May when she was sitting and eating dinner at restaurant started feeling a sense of palpitations and lightheadedness and heart rate went up to 120/min she was unable to document ECG at the time on her watch. Symptoms lasted for couple minutes and subsequently she felt tired and slowly recovered over the next couple hours.  No syncopal episodes. She does however report symptoms of palpitations where she feels brief amount of extra beats almost on a daily basis.  Reports snoring at night.  No prior sleep study.  EKG in the clinic today shows sinus rhythm heart rate 69/min, PR interval 162 ms, QRS duration 88 ms, QTc 443 ms.  Does not smoke but did have exposure to secondhand smoking growing up. Drinks alcohol occasionally in social situations. No substance abuse.   Past Medical History:  Diagnosis Date   Acne 05/02/2022   Allergic rhinitis 05/02/2022   Constipation 06/09/2010   Qualifier: Diagnosis of   By: Tina Gordon      IMO SNOMED Dx Update Oct 2024     Cough 05/02/2022   Endometriosis 06/09/2010   Qualifier: Diagnosis of   By: Tina Gordon      IMO SNOMED Dx Update Oct 2024     Exercise-induced asthma 05/02/2022   Gastro-esophageal reflux disease without esophagitis 05/02/2022   Irritable bowel syndrome 05/02/2022   Nonallopathic lesion of sacral region 06/09/2010   Qualifier: Diagnosis of   By: Tina Gordon      IMO SNOMED Dx Update Oct 2024     Nonallopathic lesion of thoracic region 06/09/2010   Qualifier: Diagnosis of   By: Tina Gordon      IMO SNOMED Dx Update Oct 2024     OTHER OVARIAN DYSFUNCTION 06/09/2010   Qualifier: Diagnosis of   By: Tina Gordon         Overweight 05/02/2022   Palpitations 05/08/2024   URINARY URGENCY, MILD 06/09/2010    Qualifier: Diagnosis of   By: Smith DO, Tina         Vaginal delivery 2005, 2009   Vitamin B12 deficiency 05/02/2022    Past Surgical History:  Procedure Laterality Date   APPENDECTOMY     WISDOM TOOTH EXTRACTION      Current Medications: Current Meds  Medication Sig   acetaminophen  (TYLENOL ) 500 MG tablet Take 2 tablets (1,000 mg total) by mouth every 8 (eight) hours.   albuterol  (VENTOLIN  HFA) 108 (90 Base) MCG/ACT inhaler Inhale 1-2 puffs into the lungs every 6 (six) hours as needed.   cetirizine (ZYRTEC) 10 MG tablet Take 10 mg by mouth daily.   Cholecalciferol (D3 PO) Take 1 tablet by mouth daily.   cyanocobalamin  (VITAMIN B12) 1000 MCG tablet Take 1,000 mcg by mouth daily.   docusate sodium (COLACE) 100 MG capsule Take 100 mg by mouth daily.   famotidine (PEPCID) 20 MG tablet Take 20 mg by mouth daily as needed for heartburn or indigestion.   mirabegron  ER (MYRBETRIQ ) 50 MG TB24 tablet Take 1 tablet (50 mg total) by mouth daily.   naproxen  (NAPROSYN ) 500 MG tablet Take  1 tablet (500 mg total) by mouth every 12 (twelve) hours as needed for pain.   omeprazole  (PRILOSEC) 40 MG capsule Take 1 capsule (40 mg total) by mouth daily 30 minutes before morning meal.     Allergies:   Codeine sulfate, Hydrocodone-acetaminophen , Levofloxacin, Oxycodone-acetaminophen , Sulfonamide derivatives, and Sulfa antibiotics   Social History   Socioeconomic History   Marital status: Married    Spouse name: Not on file   Number of children: Not on file   Years of education: Not on file   Highest education level: Not on file  Occupational History   Not on file  Tobacco Use   Smoking status: Never   Smokeless tobacco: Never  Substance and Sexual Activity   Alcohol use: Yes    Comment: occas/social   Drug use: No   Sexual activity: Yes    Birth control/protection: Condom  Other Topics Concern   Not on file  Social History Narrative   Not on file   Social Drivers of Health    Financial Resource Strain: Not on file  Food Insecurity: Not on file  Transportation Needs: Not on file  Physical Activity: Not on file  Stress: Not on file  Social Connections: Not on file     Family History: The patient's family history includes Diabetes in an other family member; Hyperlipidemia in an other family member; Hypertension in an other family member. ROS:   Please see the history of present illness.    All 14 point review of systems negative except as described per history of present illness.  EKGs/Labs/Other Studies Reviewed:    The following studies were reviewed today:   EKG:  EKG Interpretation Date/Time:  Monday May 11 2024 14:49:31 EDT Ventricular Rate:  69 PR Interval:  162 QRS Duration:  88 QT Interval:  414 QTC Calculation: 443 R Axis:   76  Text Interpretation: Normal sinus rhythm Normal ECG When compared with ECG of 19-Apr-2004 20:43, No significant change was found Confirmed by Liborio Hai reddy [phone]) on 05/11/2024 3:15:02 PM    Recent Labs: No results found for requested labs within last 365 days.  Recent Lipid Panel No results found for: CHOL, TRIG, HDL, CHOLHDL, VLDL, LDLCALC, LDLDIRECT  Physical Exam:    VS:  BP 134/86   Pulse 69   Ht 5' 6 (1.676 m)   Wt 197 lb (89.4 kg)   SpO2 98%   BMI 31.80 kg/m     Wt Readings from Last 3 Encounters:  05/11/24 197 lb (89.4 kg)  10/27/22 190 lb (86.2 kg)  09/29/18 172 lb (78 kg)     GENERAL:  Well nourished, well developed in no acute distress NECK: No JVD; No carotid bruits CARDIAC: RRR, S1 and S2 present, 3/6 systolic murmur heard right upper sternal border CHEST:  Clear to auscultation without rales, wheezing or rhonchi  Extremities: No pitting pedal edema. Pulses bilaterally symmetric with radial 2+ and dorsalis pedis 2+ NEUROLOGIC:  Alert and oriented x 3  Medication Adjustments/Labs and Tests Ordered: Current medicines are reviewed at length with the patient  today.  Concerns regarding medicines are outlined above.  Orders Placed This Encounter  Procedures   Cardiac Stress Test: Informed Consent Details: Physician/Practitioner Attestation; Transcribe to consent form and obtain patient signature   Exercise Tolerance Test   EKG 12-Lead   ECHOCARDIOGRAM COMPLETE   No orders of the defined types were placed in this encounter.   Signed, Hai jess Liborio, MD, MPH, [facility]. 05/11/2024 3:35 PM  [facility]

## 2024-05-11 NOTE — Assessment & Plan Note (Signed)
 Likely an innocent flow murmur.  Will obtain transthoracic echocardiogram to rule out any significant valvular abnormalities.

## 2024-05-16 DIAGNOSIS — R002 Palpitations: Secondary | ICD-10-CM | POA: Diagnosis not present

## 2024-05-26 ENCOUNTER — Telehealth: Payer: Self-pay

## 2024-05-26 NOTE — Telephone Encounter (Signed)
 Caller (Angie) is following up on faxed Zio heart monitor results sent on 7/21 to RN Richard's attention.  Caller stated she will be faxing the results again today to Dr. Madireddy's attention.

## 2024-05-26 NOTE — Telephone Encounter (Signed)
 Pt is calling to get her Heart monitor results

## 2024-05-27 ENCOUNTER — Telehealth: Payer: Self-pay

## 2024-05-27 ENCOUNTER — Other Ambulatory Visit: Payer: Self-pay

## 2024-05-27 DIAGNOSIS — R002 Palpitations: Secondary | ICD-10-CM

## 2024-05-27 NOTE — Telephone Encounter (Signed)
 Called the patient and informed her of Dr. Madireddy's recommendations below:  Zio patch results available from PCPs office 14 days study 04/24/2024 through 7/10/2025reviewed.  Addended office note.   - Will recommend she get evaluated for sleep apnea, please forward to PCP's recommendations so this can be scheduled if patient is agreeable.  - Will recommend evaluation with electrophysiologist, please schedule her for a visit with EP Dr. Inocencio to review event monitor results and discussing if any further extended monitoring would be recommended given her history of near syncope and palpitations.  Thank you  Patient verbalized understanding and had no further questions at this time. Ambulatory referral to EP ordered via Epic. Recommendation for sleep study sent to PCP.

## 2024-05-29 ENCOUNTER — Encounter (HOSPITAL_COMMUNITY): Payer: Self-pay | Admitting: *Deleted

## 2024-05-29 ENCOUNTER — Ambulatory Visit

## 2024-05-29 DIAGNOSIS — R002 Palpitations: Secondary | ICD-10-CM

## 2024-05-31 LAB — ECHOCARDIOGRAM COMPLETE
AR max vel: 2.06 cm2
AV Area VTI: 2.11 cm2
AV Area mean vel: 2.22 cm2
AV Mean grad: 3.5 mmHg
AV Peak grad: 8 mmHg
Ao pk vel: 1.42 m/s
Area-P 1/2: 3.93 cm2
MV VTI: 1.3 cm2
S' Lateral: 2.7 cm

## 2024-06-01 ENCOUNTER — Ambulatory Visit: Payer: Self-pay

## 2024-06-01 ENCOUNTER — Other Ambulatory Visit (HOSPITAL_COMMUNITY): Payer: Self-pay

## 2024-06-01 ENCOUNTER — Telehealth: Payer: Self-pay

## 2024-06-01 DIAGNOSIS — E0590 Thyrotoxicosis, unspecified without thyrotoxic crisis or storm: Secondary | ICD-10-CM | POA: Diagnosis not present

## 2024-06-01 NOTE — Telephone Encounter (Signed)
 Patient is requesting to switch from Dr. Liborio to Dr. Francyne.   Please advise.

## 2024-06-02 NOTE — Telephone Encounter (Signed)
No objections

## 2024-06-08 ENCOUNTER — Ambulatory Visit (HOSPITAL_COMMUNITY)
Admission: RE | Admit: 2024-06-08 | Discharge: 2024-06-08 | Disposition: A | Discharge status: Home / Self Care | Source: Ambulatory Visit | Attending: Cardiology | Admitting: Cardiology

## 2024-06-08 DIAGNOSIS — R002 Palpitations: Secondary | ICD-10-CM | POA: Diagnosis not present

## 2024-06-08 LAB — EXERCISE TOLERANCE TEST
Angina Index: 0
Duke Treadmill Score: 9
Estimated workload: 10.1
Exercise duration (min): 9 min
MPHR: 174 {beats}/min
Peak HR: 173 {beats}/min
Percent HR: 99 %
Rest HR: 64 {beats}/min
ST Depression (mm): 0 mm

## 2024-06-30 ENCOUNTER — Other Ambulatory Visit (HOSPITAL_COMMUNITY): Payer: Self-pay

## 2024-06-30 DIAGNOSIS — R0683 Snoring: Secondary | ICD-10-CM | POA: Diagnosis not present

## 2024-07-04 NOTE — Progress Notes (Unsigned)
  Electrophysiology Office Note:   Date:  07/06/2024  ID:  Tina Gordon, DOB 16-Mar-1976, MRN [id number]  Primary Cardiologist: Alean JONELLE Madireddy, MD Primary Heart Failure: None Electrophysiologist: Chitara Clonch Gladis Norton, MD      History of Present Illness:   Tina Gordon is a 48 y.o. female with h/o asthma, palpitations seen today for  for Electrophysiology evaluation of palpitations at the request of [name].    She has been having palpitations and near syncopal episodes.  She describes episodes of near syncope 1 in January and the other in May.  She has had 3 other episodes which were brief.  The most prominent episode occurred in May when she was sitting eating dinner at a restaurant.  She started feeling palpitations and lightheaded.  Heart rate went to 120 bpm.  She did not have time to document an EKG on her watch.  Symptoms lasted for a couple minutes where she felt tired and slowly recovered over the next few hours.  She did not have syncope.  She does feel brief palpitations on a daily basis.  She snores at night but has not had a sleep study.  She was initially having symptoms while at rest, but has noted most of her symptoms have occurred while she exercises.  She has had some weakness and fatigue as well.  Review of systems complete and found to be negative unless listed in HPI.   EP Information / Studies Reviewed:    EKG is ordered today. Personal review as below.  EKG Interpretation Date/Time:  Monday July 06 2024 09:23:05 EDT Ventricular Rate:  64 PR Interval:  156 QRS Duration:  86 QT Interval:  422 QTC Calculation: 435 R Axis:   64  Text Interpretation: Normal sinus rhythm Normal ECG When compared with ECG of 11-May-2024 14:49, No significant change was found Confirmed by Analeigha Nauman ([id number]) on 07/06/2024 9:26:06 AM     Risk Assessment/Calculations:           Physical Exam:   VS:  BP (!) 140/82   Pulse 64   Ht 5' 6 (1.676 m)   Wt 197 lb  9.6 oz (89.6 kg)   SpO2 97%   BMI 31.89 kg/m    Wt Readings from Last 3 Encounters:  07/06/24 197 lb 9.6 oz (89.6 kg)  05/11/24 197 lb (89.4 kg)  10/27/22 190 lb (86.2 kg)     GEN: Well nourished, well developed in no acute distress NECK: No JVD; No carotid bruits CARDIAC: Regular rate and rhythm, no murmurs, rubs, gallops RESPIRATORY:  Clear to auscultation without rales, wheezing or rhonchi  ABDOMEN: Soft, non-tender, non-distended EXTREMITIES:  No edema; No deformity   ASSESSMENT AND PLAN:    1.  Palpitations: Wore a cardiac monitor, personally reviewed which showed triggered episodes associated with sinus rhythm.  She did have some bradycardia associated with second-degree AV block, but all these episodes were nocturnal.  She has had a sleep study ordered.  She did have an exercise treadmill test that showed PACs and very short atrial runs at peak exercise.  She would like to try diltiazem  to see if this Maidie Streight improve her symptoms.  Charlott Calvario start 120 mg daily.  Follow up with Dr. Norton in 3 months  Signed, Shavonta Gossen Gladis Norton, MD

## 2024-07-06 ENCOUNTER — Other Ambulatory Visit (HOSPITAL_COMMUNITY): Payer: Self-pay

## 2024-07-06 ENCOUNTER — Ambulatory Visit: Attending: Cardiology | Admitting: Cardiology

## 2024-07-06 ENCOUNTER — Encounter: Payer: Self-pay | Admitting: Cardiology

## 2024-07-06 VITALS — BP 140/82 | HR 64 | Ht 66.0 in | Wt 197.6 lb

## 2024-07-06 DIAGNOSIS — R002 Palpitations: Secondary | ICD-10-CM | POA: Diagnosis not present

## 2024-07-06 MED ORDER — DILTIAZEM HCL ER COATED BEADS 120 MG PO CP24
120.0000 mg | ORAL_CAPSULE | Freq: Every day | ORAL | 3 refills | Status: AC
Start: 2024-07-06 — End: 2024-10-04
  Filled 2024-07-06: qty 30, 30d supply, fill #0

## 2024-07-06 NOTE — Patient Instructions (Signed)
 Medication Instructions:  Your physician has recommended you make the following change in your medication:  START Diltiazem  120 mg daily  *If you need a refill on your cardiac medications before your next appointment, please call your pharmacy*  Lab Work: None ordered  If you have any lab test that is abnormal or we need to change your treatment, we will call you to review the results.  Testing/Procedures: None ordered  Follow-Up: At [facility], you and your health needs are our priority.  As part of our continuing mission to provide you with exceptional heart care, our providers are all part of one team.  This team includes your primary Cardiologist (physician) and Advanced Practice Providers or APPs (Physician Assistants and Nurse Practitioners) who all work together to provide you with the care you need, when you need it.  Your next appointment:   3 month(s)  Provider:   Soyla Norton, MD     Thank you for choosing [facility]!!   Maeola Domino, RN [phone]   Other Instructions  Diltiazem  Tablets What is this medication? DILTIAZEM  (dil TYE a zem) treats high blood pressure and prevents chest pain (angina). It works by relaxing the blood vessels, which helps decrease the amount of work your heart has to do. It belongs to a group of medications called calcium channel blockers. This medicine may be used for other purposes; ask your health care provider or pharmacist if you have questions. COMMON BRAND NAME(S): Cardizem  What should I tell my care team before I take this medication? They need to know if you have any of these conditions: Heart attack Heart disease Irregular heartbeat or rhythm Low blood pressure An unusual or allergic reaction to diltiazem , other medications, foods, dyes, or preservatives Pregnant or trying to get pregnant Breast-feeding How should I use this medication? Take this medication by mouth. Take it as directed on the  prescription label at the same time every day. Keep taking it unless your care team tells you to stop. Talk to your care team about the use of this medication in children. Special care may be needed. Overdosage: If you think you have taken too much of this medicine contact a poison control center or emergency room at once. NOTE: This medicine is only for you. Do not share this medicine with others. What if I miss a dose? If you miss a dose, take it as soon as you can. If it is almost time for your next dose, take only that dose. Do not take double or extra doses. What may interact with this medication? Do not take this medication with any of the following: Cisapride [place] Pimozide Ranolazine Red yeast rice This medication may also interact with the following: Buspirone Carbamazepine Cimetidine Cyclosporine Digoxin Local anesthetics or general anesthetics Lovastatin Medications for anxiety or difficulty sleeping like midazolam and triazolam Medications for high blood pressure or heart problems Quinidine Rifampin, rifabutin, or rifapentine This list may not describe all possible interactions. Give your health care provider a list of all the medicines, herbs, non-prescription drugs, or dietary supplements you use. Also tell them if you smoke, drink alcohol, or use illegal drugs. Some items may interact with your medicine. What should I watch for while using this medication? Visit your care team for regular checks on your progress. Check your blood pressure as directed. Know what your blood pressure should be and when to contact your care team. Do not treat yourself for coughs, colds, or pain while you are using  this medication without asking your care team for advice. Some medications may increase your blood pressure. This medication may cause serious skin reactions. They can happen weeks to months after starting the medication. Contact your care team right away if you notice fevers or  flu-like symptoms with a rash. The rash may be red or purple and then turn into blisters or peeling of the skin. You may also notice a red rash with swelling of the face, lips, or lymph nodes in your neck or under your arms. This medication may affect your coordination, reaction time, or judgment. Do not drive or operate machinery until you know how this medication affects you. Sit up or stand slowly to reduce the risk of dizzy or fainting spells. Drinking alcohol with this medication can increase the risk of these side effects. What side effects may I notice from receiving this medication? Side effects that you should report to your care team as soon as possible: Allergic reactions--skin rash, itching, hives, swelling of the face, lips, tongue, or throat Heart failure--shortness of breath, swelling of the ankles, feet, or hands, sudden weight gain, unusual weakness or fatigue Slow heartbeat--dizziness, feeling faint or lightheaded, confusion, trouble breathing, unusual weakness or fatigue Liver injury--right upper belly pain, loss of appetite, nausea, light-colored stool, dark yellow or brown urine, yellowing skin or eyes, unusual weakness or fatigue Low blood pressure--dizziness, feeling faint or lightheaded, blurry vision Redness, blistering, peeling, or loosening of the skin, including inside the mouth Side effects that usually do not require medical attention (report these to your care team if they continue or are bothersome): Constipation Facial flushing, redness Headache This list may not describe all possible side effects. Call your doctor for medical advice about side effects. You may report side effects to FDA at 1-800-FDA-1088. Where should I keep my medication? Keep out of the reach of children and pets. Store at room temperature between 15 and 30 degrees C (59 and 86 degrees F). Protect from moisture. Keep the container tightly closed. Throw away any unused medication after the  expiration date. NOTE: This sheet is a summary. It may not cover all possible information. If you have questions about this medicine, talk to your doctor, pharmacist, or health care provider.  2024 Elsevier/Gold Standard (2023-09-27 00:00:00)

## 2024-07-22 DIAGNOSIS — R0683 Snoring: Secondary | ICD-10-CM | POA: Diagnosis not present

## 2024-07-27 DIAGNOSIS — H47322 Drusen of optic disc, left eye: Secondary | ICD-10-CM | POA: Diagnosis not present

## 2024-07-27 DIAGNOSIS — H5203 Hypermetropia, bilateral: Secondary | ICD-10-CM | POA: Diagnosis not present

## 2024-07-27 DIAGNOSIS — H52223 Regular astigmatism, bilateral: Secondary | ICD-10-CM | POA: Diagnosis not present

## 2024-07-31 ENCOUNTER — Ambulatory Visit: Admitting: Cardiovascular Disease

## 2024-09-04 ENCOUNTER — Ambulatory Visit: Attending: Cardiovascular Disease | Admitting: Cardiovascular Disease

## 2024-09-04 ENCOUNTER — Encounter: Payer: Self-pay | Admitting: Cardiovascular Disease

## 2024-09-04 VITALS — BP 128/72 | HR 77 | Ht 66.0 in | Wt 195.2 lb

## 2024-09-04 DIAGNOSIS — E538 Deficiency of other specified B group vitamins: Secondary | ICD-10-CM | POA: Diagnosis not present

## 2024-09-04 DIAGNOSIS — E559 Vitamin D deficiency, unspecified: Secondary | ICD-10-CM | POA: Diagnosis not present

## 2024-09-04 DIAGNOSIS — I441 Atrioventricular block, second degree: Secondary | ICD-10-CM

## 2024-09-04 DIAGNOSIS — E063 Autoimmune thyroiditis: Secondary | ICD-10-CM | POA: Diagnosis not present

## 2024-09-04 DIAGNOSIS — R55 Syncope and collapse: Secondary | ICD-10-CM

## 2024-09-04 DIAGNOSIS — R252 Cramp and spasm: Secondary | ICD-10-CM | POA: Diagnosis not present

## 2024-09-04 DIAGNOSIS — J45990 Exercise induced bronchospasm: Secondary | ICD-10-CM | POA: Diagnosis not present

## 2024-09-04 DIAGNOSIS — E0590 Thyrotoxicosis, unspecified without thyrotoxic crisis or storm: Secondary | ICD-10-CM | POA: Diagnosis not present

## 2024-09-04 NOTE — Patient Instructions (Addendum)
 Medication Instructions:  No changes *If you need a refill on your cardiac medications before your next appointment, please call your pharmacy*  Lab Work: None ordered If you have labs (blood work) drawn today and your tests are completely normal, you will receive your results only by: MyChart Message (if you have MyChart) OR A paper copy in the mail If you have any lab test that is abnormal or we need to change your treatment, we will call you to review the results.  Testing/Procedures: None ordered  Follow-Up: At [facility], you and your health needs are our priority.  As part of our continuing mission to provide you with exceptional heart care, our providers are all part of one team.  This team includes your primary Cardiologist (physician) and Advanced Practice Providers or APPs (Physician Assistants and Nurse Practitioners) who all work together to provide you with the care you need, when you need it.  Your next appointment:    Follow up as needed  Provider:   Dr Francyne  We recommend signing up for the patient portal called MyChart.  Sign up information is provided on this After Visit Summary.  MyChart is used to connect with patients for Virtual Visits (Telemedicine).  Patients are able to view lab/test results, encounter notes, upcoming appointments, etc.  Non-urgent messages can be sent to your provider as well.   To learn more about what you can do with MyChart, go to [email or web address].   Syncope, Adult  Syncope refers to a condition in which a person temporarily loses consciousness. Syncope may also be called fainting or passing out. It is caused by a sudden decrease in blood flow to the brain. This can happen for a variety of reasons. Most causes of syncope are not dangerous. It can be triggered by things such as needle sticks, seeing blood, pain, or intense emotion. However, syncope can also be a sign of a serious medical problem, such as a heart  abnormality. Other causes can include dehydration, migraines, or taking medicines that lower blood pressure. Your health care provider may do tests to find the reason why you are having syncope. If you faint, get medical help right away. Call your local emergency services (911 in the [place] Follow these instructions at home: Pay attention to any changes in your symptoms. Take these actions to stay safe and to help relieve your symptoms: Knowing when you may be about to faint Signs that you may be about to faint include: Feeling dizzy, weak, light-headed, or like the room is spinning. Feeling nauseous. Seeing spots or seeing all white or all black in your field of vision. Having cold, clammy skin or feeling warm and sweaty. Hearing ringing in the ears (tinnitus). If you start to feel like you might faint, sit or lie down right away. If sitting, put your head down between your legs. If lying down, raise (elevate) your feet above the level of your heart. Breathe deeply and steadily. Wait until all the symptoms have passed. Have someone stay with you until you feel stable. Medicines Take over-the-counter and prescription medicines only as told by your health care provider. If you are taking blood pressure or heart medicine, get up slowly and take several minutes to sit and then stand. This can reduce dizziness and decrease the risk of syncope. Lifestyle Do not drive, use machinery, or play sports until your health care provider says it is okay. Do not drink alcohol. Do not use any products that contain nicotine or  tobacco. These products include cigarettes, chewing tobacco, and vaping devices, such as e-cigarettes. If you need help quitting, ask your health care provider. Avoid hot tubs and saunas. General instructions Talk with your health care provider about your symptoms. You may need to have testing to understand the cause of your syncope. Drink enough fluid to keep your urine pale  yellow. Avoid prolonged standing. If you must stand for a long time, do movements such as: Moving your legs. Crossing your legs. Flexing and stretching your leg muscles. Squatting. Keep all follow-up visits. This is important. Contact a health care provider if: You have episodes of near fainting. Get help right away if: You faint. You hit your head or are injured after fainting. You have any of these symptoms that may indicate trouble with your heart: Fast or irregular heartbeats (palpitations). Unusual pain in your chest, abdomen, or back. Shortness of breath. You have a seizure. You have a severe headache. You are confused. You have vision problems. You have severe weakness or trouble walking. You are bleeding from your mouth or rectum, or you have black or tarry stool. These symptoms may represent a serious problem that is an emergency. Do not wait to see if your symptoms will go away. Get medical help right away. Call your local emergency services (911 in the [place] Do not drive yourself to the hospital. Summary Syncope refers to a condition in which a person temporarily loses consciousness. Syncope may also be called fainting or passing out. It is caused by a sudden decrease in blood flow to the brain. Signs that you may be about to faint include dizziness, feeling light-headed, feeling nauseous, sudden vision changes, or cold, clammy skin. Even though most causes of syncope are not dangerous, syncope can be a sign of a serious medical problem. Get help right away if you faint. If you start to feel like you might faint, sit or lie down right away. If sitting, put your head down between your legs. If lying down, raise (elevate) your feet above the level of your heart. This information is not intended to replace advice given to you by your health care provider. Make sure you discuss any questions you have with your health care provider. Document Revised: 02/23/2021 Document Reviewed:  02/23/2021 Elsevier Patient Education  2024 Elsevier Inc.  Vasovagal Syncope  Vasovagal syncope, also known as neurocardiogenic syncope, is a common type of fainting caused by a sudden drop in blood pressure and heart rate.  Causes:  Overstimulation of the vagus nerve, which controls heart rate and blood pressure  Triggers can include: Stress  Pain  Prolonged standing  Dehydration  Seeing blood  Symptoms:  Lightheadedness, Dizziness, Nausea, Sweating, Blurred vision, Weakness, and Loss of consciousness (fainting). Diagnosis:  Typically diagnosed based on symptoms and medical history May require an electrocardiogram (ECG) to rule out underlying heart conditions Treatment: Most episodes resolve spontaneously  If recurrent, measures to prevent fainting can be taken:  Avoid triggers  Stay hydrated  Wear compression stockings  Take medications if prescribed  Complications: Generally not serious and Risk of injury from falling during fainting.  Prognosis:  Most people with vasovagal syncope have a good prognosis Recurrences are common, but usually milder Additional Information: Vasovagal syncope is more common in young adults and women

## 2024-09-04 NOTE — Progress Notes (Addendum)
 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  She has been concerned especially after an episode of severe tachycardia and malaise while assisting during the surgical procedure.  She felt hot and flushed, clammy, had muffled hearing and extreme weakness.  She wanted to push through and finish the procedure, but her colleagues told her that she looked gray.  She sat down in a wheelchair and felt a little better but it took several minutes to recover fully.  She has had several episodes of similar although less severe tachycardia (her smart watch shows rates up into the 120s), without a clear trigger or etiology.  Her smart watch also shows her heart rate appropriately increased into the 130s during physical exercise (line dancing, gym), but she does not have any adverse symptoms during those events.  Otherwise she feels well.  She does not have exertional dyspnea or angina, orthopnea, PND, lower extremity edema, focal neurological complaints, claudication outside of these random events of tachycardia.  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  She recalls that she had not eaten anything because she been feeling unwell.  After standing for a long time during the wedding ceremony she felt the same prodromal symptoms.  She lost consciousness and recovered within a few seconds after passing out.  Tina Gordon's father has had syncopal events.  Her son has also had similar events.  Both of them are very tall and lean.  Workup has included a normal ECG stress test, normal echocardiogram and an arrhythmia monitor that showed some nocturnal  second-degree AV block Mobitz type I.  Symptom triggered recording showed sinus tachycardia.  Dr. Norton noted some very short atrial runs at peak exercise on a treadmill stress test and suggested trying diltiazem  to see if this will improve her symptoms.  She has not started this yet.  Studies Reviewed     05/11/2024 echocardiogram  1. Left ventricular ejection fraction, by estimation, is 60 to 65%. The  left ventricle has normal function. The left ventricle has no regional  wall motion abnormalities. Left ventricular diastolic parameters were  normal. The average left ventricular  global longitudinal strain is 22.2 %. The global longitudinal strain is  normal.   2. Right ventricular systolic function is normal. The right ventricular  size is normal. There is normal pulmonary artery systolic pressure.   3. The mitral valve is normal in structure. No evidence of mitral valve  regurgitation. No evidence of mitral stenosis.   4. The aortic valve is normal in structure. Aortic valve regurgitation is  not visualized. No aortic stenosis is present.   5. The inferior vena cava is normal in size with greater than 50%  respiratory variability, suggesting right atrial pressure of 3 mmHg.   06/08/2024 ECG stress test ETT with good exercise tolerance (9:00); patient did complain of chest tightness with activities; normal blood pressure response; no diagnostic ST changes; negative adequate exercise treadmill.     Risk Assessment/Calculations           Physical Exam VS:  BP 128/72 (BP Location: Left Arm, Patient Position: Sitting, Cuff Size: Large)  Pulse 77   Ht 5' 6 (1.676 m)   Wt 195 lb 3.2 oz (88.5 kg)   SpO2 99%   BMI 31.51 kg/m        Wt Readings from Last 3 Encounters:  09/04/24 195 lb 3.2 oz (88.5 kg)  07/06/24 197 lb 9.6 oz (89.6 kg)  05/11/24 197 lb (89.4 kg)    GEN: Well nourished, well developed in no acute distress NECK: No JVD; No carotid bruits CARDIAC: RRR, 1/6  early peaking aortic ejection murmur, no diastolic murmurs, rubs, gallops RESPIRATORY:  Clear to auscultation without rales, wheezing or rhonchi  ABDOMEN: Soft, non-tender, non-distended EXTREMITIES:  No edema; No deformity   ASSESSMENT AND PLAN  Near syncope/syncope: [name] describes rather typical episodes of neurally mediated near syncope.  She has only had 1 syncopal event, years ago at her sister's wedding.  All her symptomatic events while wearing the arrhythmia monitor were correlated with unexplained sinus tachycardia, but she did not proceed to full-blown syncope.  She does not have any structural heart disease by echo or treadmill stress testing.  The exact trigger is unclear.  Recommended staying very well-hydrated and eating a sodium rich diet.  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  Most importantly, if she has the typical prodromal complaints, she should immediately lay down to avoid injury. Second-degree AV block, Mobitz type I: This was only seen at night and is probably physiological.  She had a home sleep study that did not record appropriately and is scheduled to repeat this. PAT: Brief episodes seen at peak exercise on a treadmill stress test.  She has not started the prescribed diltiazem .  She has no symptoms during exercise.  Since these brief episodes of atrial tachycardia or asymptomatic and her symptoms are related to sinus tachycardia, not sure the medication will help.         Dispo: Follow-up as needed  Signed, Jerel Balding, MD

## 2024-09-21 DIAGNOSIS — G4719 Other hypersomnia: Secondary | ICD-10-CM | POA: Diagnosis not present

## 2024-10-04 ENCOUNTER — Encounter (HOSPITAL_COMMUNITY): Payer: Self-pay

## 2024-10-04 ENCOUNTER — Ambulatory Visit (HOSPITAL_COMMUNITY)

## 2024-10-04 ENCOUNTER — Ambulatory Visit (HOSPITAL_COMMUNITY)
Admission: EM | Admit: 2024-10-04 | Discharge: 2024-10-04 | Disposition: A | Discharge status: Home / Self Care | Attending: Physician Assistant | Admitting: Physician Assistant

## 2024-10-04 DIAGNOSIS — S6992XA Unspecified injury of left wrist, hand and finger(s), initial encounter: Secondary | ICD-10-CM | POA: Diagnosis not present

## 2024-10-04 DIAGNOSIS — S6392XA Sprain of unspecified part of left wrist and hand, initial encounter: Secondary | ICD-10-CM | POA: Diagnosis not present

## 2024-10-04 DIAGNOSIS — W19XXXA Unspecified fall, initial encounter: Secondary | ICD-10-CM | POA: Diagnosis not present

## 2024-10-04 DIAGNOSIS — M79645 Pain in left finger(s): Secondary | ICD-10-CM | POA: Diagnosis not present

## 2024-10-04 MED ORDER — NAPROXEN 500 MG PO TABS: 500.0000 mg | ORAL_TABLET | Freq: Two times a day (BID) | ORAL | 0 refills | Status: AC

## 2024-10-04 NOTE — Discharge Instructions (Addendum)
 Your x-rays of your hand/wrist were negative for fracture or dislocation.   Wear the brace we provided in the clinic for the next couple of weeks to provide compression, stability, and comfort.  Please rest, ice, and elevate your injury to help it heal and decrease inflammation.   Take naproxen  500mg  every 12 hours as needed for pain and swelling with food.   Call the orthopedic provider listed on your discharge paperwork to schedule a follow-up appointment if your symptoms do not improve in the next 1-2 weeks with supportive care.  Return to urgent care if you experience worsening pain, numbness, tingling, change of color in your skin near the injury, or any other concerning symptoms.  I hope you feel better!

## 2024-10-04 NOTE — ED Triage Notes (Signed)
 Onset midnight the Patient was walking her dogs. Stepped on a wooden plank that was icy. Slipped, fell on pavers, catching herself with the left hand. Left thumb and palm bruised , painful, and swollen.   Patient took motrin after the fall with mild relief.

## 2024-10-05 NOTE — ED Provider Notes (Signed)
 [facility]    CSN: [id number] Arrival date & time: 10/04/24  1834      History   Chief Complaint Chief Complaint  Patient presents with   Hand Injury    HPI Tina Gordon is a 48 y.o. female.   Tina Gordon is a 48 y.o. female presenting for chief complaint of Hand Injury that happened last night while she was walking her dogs. Patient fell onto her outstretched left hand after tripping and slipping on a piece of ice. She did not hit her head when she fell and denies LOC. She is experiencing pain and swelling to the left thumb/snuffbox region. States the swelling has reduced significantly in size since applying ice last night and throughout the day today. She took ibuprofen with slight improvement in pain today. Denies previous injury to the left hand, numbness/tingling to the distal left hand, and open wounds of the left hand.    Hand Injury   Past Medical History:  Diagnosis Date   Acne 05/02/2022   Allergic rhinitis 05/02/2022   Constipation 06/09/2010   Qualifier: Diagnosis of   By: Claudene ROSALEA Hussar      IMO SNOMED Dx Update Oct 2024     Cough 05/02/2022   Endometriosis 06/09/2010   Qualifier: Diagnosis of   By: Claudene ROSALEA Hussar      IMO SNOMED Dx Update Oct 2024     Exercise-induced asthma 05/02/2022   Gastro-esophageal reflux disease without esophagitis 05/02/2022   Irritable bowel syndrome 05/02/2022   Nonallopathic lesion of sacral region 06/09/2010   Qualifier: Diagnosis of   By: Claudene ROSALEA Hussar      IMO SNOMED Dx Update Oct 2024     Nonallopathic lesion of thoracic region 06/09/2010   Qualifier: Diagnosis of   By: Claudene ROSALEA Hussar      IMO SNOMED Dx Update Oct 2024     OTHER OVARIAN DYSFUNCTION 06/09/2010   Qualifier: Diagnosis of   By: Claudene ROSALEA Hussar         Overweight 05/02/2022   Palpitations 05/08/2024   URINARY URGENCY, MILD 06/09/2010   Qualifier: Diagnosis of   By: Claudene ROSALEA Hussar         Vaginal delivery 2005, 2009   Vitamin  B12 deficiency 05/02/2022    Patient Active Problem List   Diagnosis Date Noted   Systolic murmur 05/11/2024   Palpitations 05/08/2024   Vaginal delivery    Acne 05/02/2022   Allergic rhinitis 05/02/2022   Cough 05/02/2022   Exercise-induced asthma 05/02/2022   Gastro-esophageal reflux disease without esophagitis 05/02/2022   Irritable bowel syndrome 05/02/2022   Overweight 05/02/2022   Vitamin B12 deficiency 05/02/2022   OTHER OVARIAN DYSFUNCTION 06/09/2010   Constipation 06/09/2010   Endometriosis 06/09/2010   NONALLOPATHIC LESION OF CERVICAL REGION NEC 06/09/2010   Nonallopathic lesion of thoracic region 06/09/2010   Nonallopathic lesion of sacral region 06/09/2010   URINARY URGENCY, MILD 06/09/2010    Past Surgical History:  Procedure Laterality Date   APPENDECTOMY     WISDOM TOOTH EXTRACTION      OB History   No obstetric history on file.      Home Medications    Prior to Admission medications   Medication Sig Start Date End Date Taking? Authorizing Provider  cetirizine (ZYRTEC) 10 MG tablet Take 10 mg by mouth daily.   Yes [provider]  cyanocobalamin  (VITAMIN B12) 1000 MCG tablet Take 1,000 mcg by mouth daily.   Yes [provider]  docusate sodium (COLACE) 100 MG capsule Take 100 mg by mouth daily.   Yes [provider]  famotidine (PEPCID) 20 MG tablet Take 20 mg by mouth daily as needed for heartburn or indigestion. 02/17/20  Yes [provider]  mirabegron  ER (MYRBETRIQ ) 50 MG TB24 tablet Take 1 tablet (50 mg total) by mouth daily. 07/19/23  Yes   naproxen  (NAPROSYN ) 500 MG tablet Take 1 tablet (500 mg total) by mouth 2 (two) times daily. 10/04/24  Yes Enedelia Dorna HERO, FNP  omeprazole  (PRILOSEC) 40 MG capsule Take 1 capsule (40 mg total) by mouth daily 30 minutes before morning meal. 11/28/23  Yes   diltiazem  (CARDIZEM  CD) 120 MG 24 hr capsule Take 1 capsule (120 mg total) by mouth daily. Patient not taking: No sig  reported 07/06/24 10/04/24  Camnitz, Soyla Lunger, MD    Family History Family History  Problem Relation Age of Onset   Hypertension Other    Diabetes Other    Hyperlipidemia Other     Social History Social History   Tobacco Use   Smoking status: Never   Smokeless tobacco: Never  Substance Use Topics   Alcohol use: Yes    Comment: occas/social   Drug use: No     Allergies   Codeine sulfate, Hydrocodone-acetaminophen , Levofloxacin, Oxycodone-acetaminophen , Sulfonamide derivatives, and Sulfa antibiotics   Review of Systems Review of Systems Per HPI  Physical Exam Triage Vital Signs ED Triage Vitals  Encounter Vitals Group     BP 10/04/24 1914 (!) 145/78     Girls Systolic BP Percentile --      Girls Diastolic BP Percentile --      Boys Systolic BP Percentile --      Boys Diastolic BP Percentile --      Pulse Rate 10/04/24 1914 76     Resp 10/04/24 1914 18     Temp 10/04/24 1914 98.5 F (36.9 C)     Temp Source 10/04/24 1914 Oral     SpO2 10/04/24 1914 99 %     Weight --      Height 10/04/24 1914 5' 6 (1.676 m)     Head Circumference --      Peak Flow --      Pain Score 10/04/24 1912 3     Pain Loc --      Pain Education --      Exclude from Growth Chart --    No data found.  Updated Vital Signs BP (!) 145/78 (BP Location: Right Arm)   Pulse 76   Temp 98.5 F (36.9 C) (Oral)   Resp 18   Ht 5' 6 (1.676 m)   LMP  (LMP Unknown)   SpO2 99%   BMI 31.51 kg/m   Visual Acuity Right Eye Distance:   Left Eye Distance:   Bilateral Distance:    Right Eye Near:   Left Eye Near:    Bilateral Near:     Physical Exam Vitals and nursing note reviewed.  Constitutional:      Appearance: She is not ill-appearing or toxic-appearing.  HENT:     Head: Normocephalic and atraumatic.     Right Ear: Hearing and external ear normal.     Left Ear: Hearing and external ear normal.     Nose: Nose normal.     Mouth/Throat:     Lips: Pink.  Eyes:     General: Lids  are normal. Vision grossly intact. Gaze aligned appropriately.  Extraocular Movements: Extraocular movements intact.     Conjunctiva/sclera: Conjunctivae normal.  Pulmonary:     Effort: Pulmonary effort is normal.  Musculoskeletal:     Right wrist: Normal.     Left wrist: Normal.     Right hand: Normal.     Left hand: Swelling and tenderness present. No deformity, lacerations or bony tenderness. Decreased range of motion (Minimally decreased ROM of the left thumb with opposition secondary to pain). Normal strength. Normal sensation. There is no disruption of two-point discrimination. Normal capillary refill. Normal pulse.     Cervical back: Neck supple.     Comments: Left hand: +2 left radial pulse, less than 2 cap refill, 5/5 left hand grip strength  Skin:    General: Skin is warm and dry.     Capillary Refill: Capillary refill takes less than 2 seconds.     Findings: No rash.  Neurological:     General: No focal deficit present.     Mental Status: She is alert and oriented to person, place, and time. Mental status is at baseline.     Cranial Nerves: No dysarthria or facial asymmetry.  Psychiatric:        Mood and Affect: Mood normal.        Speech: Speech normal.        Behavior: Behavior normal.        Thought Content: Thought content normal.        Judgment: Judgment normal.      UC Treatments / Results  Labs (all labs ordered are listed, but only abnormal results are displayed) Labs Reviewed - No data to display  EKG   Radiology DG Wrist Complete Left Result Date: 10/04/2024 CLINICAL DATA:  Slipped and fell, left hand and wrist injury, left thumb pain EXAM: LEFT WRIST - COMPLETE 3+ VIEW; LEFT HAND - COMPLETE 3+ VIEW COMPARISON:  None Available. FINDINGS: Left hand: Frontal, oblique, and lateral views are obtained. No acute fracture, subluxation, or dislocation. Joint spaces are well preserved. Soft tissues are unremarkable. Left wrist: Frontal, oblique, lateral, and  ulnar deviated views of the left wrist are obtained. No acute displaced fracture, subluxation, or dislocation. Joint spaces are well preserved. Soft tissues are unremarkable. IMPRESSION: 1. Unremarkable left hand and left wrist.  No displaced fractures. Electronically Signed   By: Ozell Daring M.D.   On: 10/04/2024 19:43   DG Hand Complete Left Result Date: 10/04/2024 CLINICAL DATA:  Slipped and fell, left hand and wrist injury, left thumb pain EXAM: LEFT WRIST - COMPLETE 3+ VIEW; LEFT HAND - COMPLETE 3+ VIEW COMPARISON:  None Available. FINDINGS: Left hand: Frontal, oblique, and lateral views are obtained. No acute fracture, subluxation, or dislocation. Joint spaces are well preserved. Soft tissues are unremarkable. Left wrist: Frontal, oblique, lateral, and ulnar deviated views of the left wrist are obtained. No acute displaced fracture, subluxation, or dislocation. Joint spaces are well preserved. Soft tissues are unremarkable. IMPRESSION: 1. Unremarkable left hand and left wrist.  No displaced fractures. Electronically Signed   By: Ozell Daring M.D.   On: 10/04/2024 19:43    Procedures Procedures (including critical care time)  Medications Ordered in UC Medications - No data to display  Initial Impression / Assessment and Plan / UC Course  I have reviewed the triage vital signs and the nursing notes.  Pertinent labs & imaging results that were available during my care of the patient were reviewed by me and considered in my medical decision making (see chart  for details).     *** Final Clinical Impressions(s) / UC Diagnoses   Final diagnoses:  Sprain of left hand, initial encounter  Fall, initial encounter     Discharge Instructions      Your x-rays of your hand/wrist were negative for fracture or dislocation.   Wear the brace we provided in the clinic for the next couple of weeks to provide compression, stability, and comfort.  Please rest, ice, and elevate your injury to  help it heal and decrease inflammation.   Take naproxen  500mg  every 12 hours as needed for pain and swelling with food.   Call the orthopedic provider listed on your discharge paperwork to schedule a follow-up appointment if your symptoms do not improve in the next 1-2 weeks with supportive care.  Return to urgent care if you experience worsening pain, numbness, tingling, change of color in your skin near the injury, or any other concerning symptoms.  I hope you feel better!     ED Prescriptions     Medication Sig Dispense Auth. Provider   naproxen  (NAPROSYN ) 500 MG tablet Take 1 tablet (500 mg total) by mouth 2 (two) times daily. 30 tablet Enedelia Dorna HERO, FNP      PDMP not reviewed this encounter.

## 2024-10-07 ENCOUNTER — Telehealth: Payer: Self-pay | Admitting: Cardiology

## 2024-10-07 NOTE — Telephone Encounter (Signed)
 Called patient back about message. Patient stated she was not taking diltiazem  that Dr. Inocencio prescribed for palpitations, and she just saw Dr. Francyne on 09/04/24. Patient stated she canceled her appointment and her palpitations are better, but she still has them, just not as bad. Informed patient that a message would be sent to Dr. Inocencio so he is aware.

## 2024-10-07 NOTE — Telephone Encounter (Signed)
 Dr. Inocencio please advise on recommended follow up (was supposed to have 38mo post Diltiazem  start, but she is saying she is not taking the medication and palpitations are better)...SABRASABRA

## 2024-10-07 NOTE — Telephone Encounter (Signed)
 FYI Pt does not feel she needs to come in. She has not been taking the medication.

## 2024-10-14 ENCOUNTER — Ambulatory Visit: Admitting: Cardiology

## 2024-10-27 ENCOUNTER — Other Ambulatory Visit (HOSPITAL_COMMUNITY): Payer: Self-pay

## 2024-10-27 MED ORDER — OMEPRAZOLE 40 MG PO CPDR
40.0000 mg | DELAYED_RELEASE_CAPSULE | ORAL | 1 refills | Status: AC
  Filled 2024-10-27: qty 90, 90d supply, fill #0

## 2024-11-25 ENCOUNTER — Other Ambulatory Visit (HOSPITAL_COMMUNITY): Payer: Self-pay

## 2024-11-30 ENCOUNTER — Other Ambulatory Visit (HOSPITAL_COMMUNITY): Payer: Self-pay

## 2024-12-02 ENCOUNTER — Other Ambulatory Visit (HOSPITAL_COMMUNITY): Payer: Self-pay
